# Patient Record
Sex: Female | Born: 1970
Health system: Southern US, Community
[De-identification: ages and names within clinical notes are randomized; demographics above are authoritative.]

## PROBLEM LIST (undated history)

## (undated) DIAGNOSIS — R32 Unspecified urinary incontinence: Secondary | ICD-10-CM

## (undated) DIAGNOSIS — F419 Anxiety disorder, unspecified: Secondary | ICD-10-CM

## (undated) DIAGNOSIS — I10 Essential (primary) hypertension: Secondary | ICD-10-CM

## (undated) HISTORY — DX: Essential (primary) hypertension: I10

## (undated) HISTORY — DX: Anxiety disorder, unspecified: F41.9

## (undated) HISTORY — PX: FOOT SURGERY: SHX648

## (undated) HISTORY — DX: Unspecified urinary incontinence: R32

## (undated) HISTORY — PX: WISDOM TOOTH EXTRACTION: SHX21

---

## 1998-11-14 ENCOUNTER — Other Ambulatory Visit: Admission: RE | Admit: 1998-11-14 | Discharge: 1998-11-14 | Payer: Self-pay | Admitting: Family Medicine

## 1999-01-17 ENCOUNTER — Ambulatory Visit (HOSPITAL_COMMUNITY): Admission: RE | Admit: 1999-01-17 | Discharge: 1999-01-17 | Payer: Self-pay | Admitting: *Deleted

## 1999-05-24 ENCOUNTER — Inpatient Hospital Stay (HOSPITAL_COMMUNITY): Admission: AD | Admit: 1999-05-24 | Discharge: 1999-05-26 | Payer: Self-pay | Admitting: Family Medicine

## 1999-12-23 ENCOUNTER — Other Ambulatory Visit: Admission: RE | Admit: 1999-12-23 | Discharge: 1999-12-23 | Payer: Self-pay | Admitting: Family Medicine

## 2000-12-30 ENCOUNTER — Other Ambulatory Visit: Admission: RE | Admit: 2000-12-30 | Discharge: 2000-12-30 | Payer: Self-pay | Admitting: Family Medicine

## 2001-12-19 ENCOUNTER — Other Ambulatory Visit: Admission: RE | Admit: 2001-12-19 | Discharge: 2001-12-19 | Payer: Self-pay | Admitting: Family Medicine

## 2003-02-21 ENCOUNTER — Other Ambulatory Visit: Admission: RE | Admit: 2003-02-21 | Discharge: 2003-02-21 | Payer: Self-pay | Admitting: Family Medicine

## 2003-10-26 ENCOUNTER — Other Ambulatory Visit: Admission: RE | Admit: 2003-10-26 | Discharge: 2003-10-26 | Payer: Self-pay | Admitting: Family Medicine

## 2003-12-17 ENCOUNTER — Ambulatory Visit (HOSPITAL_COMMUNITY): Admission: RE | Admit: 2003-12-17 | Discharge: 2003-12-17 | Payer: Self-pay | Admitting: Family Medicine

## 2004-05-08 ENCOUNTER — Inpatient Hospital Stay (HOSPITAL_COMMUNITY): Admission: AD | Admit: 2004-05-08 | Discharge: 2004-05-10 | Payer: Self-pay | Admitting: Family Medicine

## 2004-11-20 ENCOUNTER — Other Ambulatory Visit: Admission: RE | Admit: 2004-11-20 | Discharge: 2004-11-20 | Payer: Self-pay | Admitting: Family Medicine

## 2005-12-03 ENCOUNTER — Other Ambulatory Visit: Admission: RE | Admit: 2005-12-03 | Discharge: 2005-12-03 | Payer: Self-pay | Admitting: Family Medicine

## 2007-01-06 ENCOUNTER — Other Ambulatory Visit: Admission: RE | Admit: 2007-01-06 | Discharge: 2007-01-06 | Payer: Self-pay | Admitting: Family Medicine

## 2011-06-30 ENCOUNTER — Encounter: Payer: Self-pay | Admitting: *Deleted

## 2011-06-30 ENCOUNTER — Emergency Department (HOSPITAL_COMMUNITY): Payer: Worker's Compensation

## 2011-06-30 ENCOUNTER — Emergency Department (HOSPITAL_COMMUNITY)
Admission: EM | Admit: 2011-06-30 | Discharge: 2011-06-30 | Disposition: A | Discharge status: Home / Self Care | Payer: Worker's Compensation | Attending: Emergency Medicine | Admitting: Emergency Medicine

## 2011-06-30 DIAGNOSIS — S52123A Displaced fracture of head of unspecified radius, initial encounter for closed fracture: Secondary | ICD-10-CM | POA: Insufficient documentation

## 2011-06-30 DIAGNOSIS — X58XXXA Exposure to other specified factors, initial encounter: Secondary | ICD-10-CM | POA: Insufficient documentation

## 2011-06-30 DIAGNOSIS — F17200 Nicotine dependence, unspecified, uncomplicated: Secondary | ICD-10-CM | POA: Insufficient documentation

## 2011-06-30 DIAGNOSIS — S82009A Unspecified fracture of unspecified patella, initial encounter for closed fracture: Secondary | ICD-10-CM

## 2011-06-30 MED ORDER — OXYCODONE-ACETAMINOPHEN 5-325 MG PO TABS
2.0000 | ORAL_TABLET | Freq: Once | ORAL | Status: AC
  Administered 2011-06-30: 2 via ORAL
  Filled 2011-06-30: qty 2

## 2011-06-30 MED ORDER — ONDANSETRON HCL 4 MG PO TABS
4.0000 mg | ORAL_TABLET | Freq: Once | ORAL | Status: AC
  Administered 2011-06-30: 4 mg via ORAL
  Filled 2011-06-30: qty 1

## 2011-06-30 MED ORDER — IBUPROFEN 800 MG PO TABS
800.0000 mg | ORAL_TABLET | Freq: Once | ORAL | Status: AC
  Administered 2011-06-30: 800 mg via ORAL
  Filled 2011-06-30: qty 1

## 2011-06-30 MED ORDER — OXYCODONE-ACETAMINOPHEN 5-325 MG PO TABS
ORAL_TABLET | ORAL | Status: DC
Start: 2011-06-30 — End: 2013-05-05

## 2011-06-30 NOTE — ED Notes (Signed)
Pt reports she fell over open file cabinet drawer. Arm presently in sling.  Denies numbness or tingling in either extremity.  Able to move extremities.

## 2011-06-30 NOTE — ED Notes (Signed)
C/o right elbow pain and left knee pain s/p fall at work today; pt was seen by her PCP and had xray done of right elbow, but not knee.  Pt states she had her drug screen required by her employer performed at her PCP office today, and has accompanying paper work.

## 2011-06-30 NOTE — ED Notes (Signed)
Left in c/o family for transport home; alert, in no distress.

## 2011-06-30 NOTE — ED Provider Notes (Addendum)
History     CSN: [id number] Arrival date & time: 06/30/2011  7:48 PM  Chief Complaint  Patient presents with  . Arm Injury  . Leg Injury    (Consider location/radiation/quality/duration/timing/severity/associated sxs/prior treatment) Patient is a 40 y.o. female presenting with fall. The history is provided by the patient.  Fall The accident occurred 1 to 2 hours ago. Incident: fell over an open cabinet file drawer. She landed on a hard floor. The point of impact was the right shoulder, right elbow and left knee. The pain is present in the right elbow, left knee and right shoulder. The pain is severe. She was not ambulatory at the scene. Associated symptoms include nausea. Pertinent negatives include no visual change, no numbness, no abdominal pain, no bowel incontinence, no hematuria and no loss of consciousness. Exacerbated by: nothing.    History reviewed. No pertinent past medical history.  Past Surgical History  Procedure Date  . Foot surgery     No family history on file.  History  Substance Use Topics  . Smoking status: Current Everyday Smoker -- 0.5 packs/day  . Smokeless tobacco: Not on file  . Alcohol Use: No    OB History    Grav Para Term Preterm Abortions TAB SAB Ect Mult Living                  Review of Systems  Constitutional: Negative for activity change.       All ROS Neg except as noted in HPI  HENT: Negative for nosebleeds and neck pain.   Eyes: Negative for photophobia and discharge.  Respiratory: Negative for cough, shortness of breath and wheezing.   Cardiovascular: Negative for chest pain and palpitations.  Gastrointestinal: Positive for nausea. Negative for abdominal pain, blood in stool and bowel incontinence.  Genitourinary: Negative for dysuria, frequency and hematuria.  Musculoskeletal: Positive for arthralgias. Negative for back pain.  Skin: Negative.   Neurological: Negative for dizziness, seizures, loss of consciousness, speech  difficulty and numbness.  Psychiatric/Behavioral: Negative for hallucinations and confusion.    Allergies  Review of patient's allergies indicates no known allergies.  Home Medications   Current Outpatient Rx  Name Route Sig Dispense Refill  . OXYCODONE-ACETAMINOPHEN 5-325 MG PO TABS  1 or 2 po q4h prn pain 24 tablet 0    BP 127/77  Pulse 97  Temp(Src) 99.2 F (37.3 C) (Oral)  Resp 20  Ht 5\' 7"  (1.702 m)  Wt 210 lb (95.255 kg)  BMI 32.89 kg/m2  SpO2 98%  Physical Exam  Nursing note and vitals reviewed. Constitutional: She is oriented to person, place, and time. She appears well-developed and well-nourished.  Non-toxic appearance.  HENT:  Head: Normocephalic.  Right Ear: Tympanic membrane and external ear normal.  Left Ear: Tympanic membrane and external ear normal.  Eyes: EOM and lids are normal. Pupils are equal, round, and reactive to light.  Neck: Normal range of motion. Neck supple. Carotid bruit is not present.  Cardiovascular: Normal rate, regular rhythm, normal heart sounds, intact distal pulses and normal pulses.   Pulmonary/Chest: Breath sounds normal. No respiratory distress.  Abdominal: Soft. Bowel sounds are normal. There is no tenderness. There is no guarding.  Musculoskeletal: Normal range of motion.       There is pain to palpation of the right elbow there is swelling present. There is good range of motion of the right shoulder and right wrist.  There is good range of motion of the left hip there is pain  and swelling of the left knee. Patient cannot be in the left knee due to pain. There is no deformity of the tib-fib area. There is good range of motion of the left ankle and left toes. The distal pulses are symmetrical.  Lymphadenopathy:       Head (right side): No submandibular adenopathy present.       Head (left side): No submandibular adenopathy present.    She has no cervical adenopathy.  Neurological: She is alert and oriented to person, place, and  time. She has normal strength. No cranial nerve deficit or sensory deficit.  Skin: Skin is warm and dry.       Multiple abrasions present.  Psychiatric: She has a normal mood and affect. Her speech is normal.    ED Course  Procedures (including critical care time)FRACTURE CARE: Pt educated on the suspected fracture of the right elbow. Pt fitted with a sling, and elbow immobilized with long arm splint. FRACTURE CARE: - Pt educated on the fracture of the patella. Pt fitted with an immobilizer. Arrangements made for pt to receive walker. Pt tolerated procedures without problem.  Labs Reviewed - No data to display Dg Elbow Complete Right  06/30/2011  *RADIOLOGY REPORT*  Clinical Data: Trauma and pain.  RIGHT ELBOW - COMPLETE 3+ VIEW  Comparison: None.  Findings: Elevation of the anterior fat pad.  Presence of a subtle posterior fat pad.  On the third image, subtle irregularity of the radial head/neck junction, suspicious for fracture.  No dislocation.  IMPRESSION: Joint effusion, likely secondary to a subtle radial head/neck junction fracture.  Original Report Authenticated By: Consuello Bossier, M.D.   Dg Knee Complete 4 Views Left  06/30/2011  *RADIOLOGY REPORT*  Clinical Data: Trauma and pain.  LEFT KNEE - COMPLETE 4+ VIEW  Comparison: None.  Findings: Small suprapatellar joint effusion.  On the first oblique image, lucency through the superior lateral aspect of the patella.  IMPRESSION:  1.  Small joint effusion. 2.  Lucency through the superior lateral patella.  Favored to represent a nondisplaced fracture.  Bipartite patella felt less likely.  Original Report Authenticated By: Consuello Bossier, M.D.     1. Radial head fracture   2. Patella fracture       MDM  I have reviewed nursing notes, vital signs, and all appropriate lab and imaging results for this patient.  [place] xray reveals  A fat pad sign consistent with a radial head fx. Left knee xray suggest  Patella fracture. Splints have been  applied. Case discussed with orthopedics. Rx for Percocet ordered. Pt to return if any problems before seeing orthopedic consultant.      Kathie Dike, PA 06/30/11 [id number]  Kathie Dike, PA 10/13/11 1701  Kathie Dike, [place] 11/26/11 1349

## 2011-06-30 NOTE — ED Notes (Signed)
Posterior long arm splint applied RUE; knee immobilizer applied LLE. Tolerated well; pt instructed on proper splint care and how to monitor for proper circulation RUE.

## 2011-07-04 NOTE — ED Provider Notes (Signed)
Medical screening examination/treatment/procedure(s) were performed by non-physician practitioner and as supervising physician I was immediately available for consultation/collaboration.   Gerhard Munch, MD 07/04/11 [phone]

## 2011-10-13 NOTE — ED Provider Notes (Signed)
Medical screening examination/treatment/procedure(s) were performed by non-physician practitioner and as supervising physician I was immediately available for consultation/collaboration.   Gerhard Munch, MD 10/13/11 [phone]

## 2011-11-26 NOTE — ED Provider Notes (Signed)
Medical screening examination/treatment/procedure(s) were performed by non-physician practitioner and as supervising physician I was immediately available for consultation/collaboration.   Gerhard Munch, MD 11/26/11 1500

## 2013-03-30 ENCOUNTER — Telehealth: Payer: Self-pay | Admitting: Nurse Practitioner

## 2013-03-30 NOTE — Telephone Encounter (Signed)
appt given  

## 2013-05-05 ENCOUNTER — Ambulatory Visit (INDEPENDENT_AMBULATORY_CARE_PROVIDER_SITE_OTHER): Payer: BC Managed Care – PPO | Admitting: Nurse Practitioner

## 2013-05-05 ENCOUNTER — Encounter: Payer: Self-pay | Admitting: Nurse Practitioner

## 2013-05-05 VITALS — BP 120/78 | HR 83 | Temp 97.8°F | Ht 66.5 in | Wt 213.2 lb

## 2013-05-05 DIAGNOSIS — Z0000 Encounter for general adult medical examination without abnormal findings: Secondary | ICD-10-CM

## 2013-05-05 DIAGNOSIS — Z01419 Encounter for gynecological examination (general) (routine) without abnormal findings: Secondary | ICD-10-CM

## 2013-05-05 DIAGNOSIS — Z124 Encounter for screening for malignant neoplasm of cervix: Secondary | ICD-10-CM

## 2013-05-05 LAB — POCT CBC
Granulocyte percent: 73.5 %G (ref 37–80)
Hemoglobin: 14.7 g/dL (ref 12.2–16.2)
Lymph, poc: 2.2 (ref 0.6–3.4)
MCHC: 33.4 g/dL (ref 31.8–35.4)
MPV: 7.9 fL (ref 0–99.8)
POC Granulocyte: 6.9 (ref 2–6.9)
POC LYMPH PERCENT: 23.5 %L (ref 10–50)
RDW, POC: 12.6 %

## 2013-05-05 LAB — POCT URINALYSIS DIPSTICK
Blood, UA: NEGATIVE
Glucose, UA: NEGATIVE
Nitrite, UA: NEGATIVE
Spec Grav, UA: >=1.03
Urobilinogen, UA: NEGATIVE
pH, UA: 6

## 2013-05-05 LAB — POCT UA - MICROSCOPIC ONLY
Casts, Ur, LPF, POC: NEGATIVE
Yeast, UA: NEGATIVE

## 2013-05-05 NOTE — Patient Instructions (Signed)

## 2013-05-05 NOTE — Progress Notes (Signed)
  Subjective:    Patient ID: Michelle Blake, female    DOB: 1971-05-14, 42 y.o.   MRN: [id number]  HPI Patient in Today for a CPE and PAP - She is doing well . SHe has no current medical problems.No complaints today.    Review of Systems  All other systems reviewed and are negative.       Objective:   Physical Exam  Constitutional: She is oriented to person, place, and time. She appears well-developed and well-nourished.  HENT:  Head: Normocephalic.  Right Ear: Hearing, tympanic membrane, external ear and ear canal normal.  Left Ear: Hearing, tympanic membrane, external ear and ear canal normal.  Nose: Nose normal.  Mouth/Throat: Uvula is midline and oropharynx is clear and moist.  Eyes: Conjunctivae and EOM are normal. Pupils are equal, round, and reactive to light.  Neck: Normal range of motion and full passive range of motion without pain. Neck supple. No JVD present. Carotid bruit is not present. No mass and no thyromegaly present.  Cardiovascular: Normal rate, normal heart sounds and intact distal pulses.   No murmur heard. Pulmonary/Chest: Effort normal and breath sounds normal. Right breast exhibits no inverted nipple, no mass, no nipple discharge, no skin change and no tenderness. Left breast exhibits no inverted nipple, no mass, no nipple discharge, no skin change and no tenderness.  Abdominal: Soft. Bowel sounds are normal. She exhibits no mass. There is no tenderness.  Genitourinary: Vagina normal and uterus normal. No breast swelling, tenderness, discharge or bleeding.  bimanual exam-No adnexal masses or tenderness.  Cervix parous and pink- NO discharge  Musculoskeletal: Normal range of motion.  Lymphadenopathy:    She has no cervical adenopathy.  Neurological: She is alert and oriented to person, place, and time.  Skin: Skin is warm and dry.  Psychiatric: She has a normal mood and affect. Her behavior is normal. Judgment and thought content normal.   BP 120/78   Pulse 83  Temp(Src) 97.8 F (36.6 C) (Oral)  Ht 5' 6.5" (1.689 m)  Wt 213 lb 3.2 oz (96.707 kg)  BMI 33.9 kg/m2        Assessment & Plan:  1. Encounter for routine gynecological examination  - POCT UA - Microscopic Only - POCT urinalysis dipstick - Pap IG w/ reflex to HPV when ASC-U  2. Annual physical exam  - POCT CBC - CMP14+EGFR - NMR, lipoprofile - Thyroid Panel With TSH - Vitamin D 25 hydroxy  Diet and exercise encouraged Health maintenance discussed Patient will schedule mammogram upon leaving [facility] in 1 year for follow up and PRN  Mary-Margaret Daphine Deutscher, FNP

## 2013-05-07 LAB — VITAMIN D 25 HYDROXY (VIT D DEFICIENCY, FRACTURES): Vit D, 25-Hydroxy: 16.7 ng/mL — ABNORMAL LOW (ref 30.0–100.0)

## 2013-05-07 LAB — CMP14+EGFR
ALT: 17 IU/L (ref 0–32)
Albumin: 4.2 g/dL (ref 3.5–5.5)
BUN: 11 mg/dL (ref 6–24)
Calcium: 9.2 mg/dL (ref 8.7–10.2)
Chloride: 103 mmol/L (ref 97–108)
Glucose: 101 mg/dL — ABNORMAL HIGH (ref 65–99)
Potassium: 4.5 mmol/L (ref 3.5–5.2)
Total Protein: 6.6 g/dL (ref 6.0–8.5)

## 2013-05-07 LAB — THYROID PANEL WITH TSH
Free Thyroxine Index: 2.6 (ref 1.2–4.9)
T3 Uptake Ratio: 30 % (ref 24–39)
T4, Total: 8.8 ug/dL (ref 4.5–12.0)

## 2013-05-07 LAB — NMR, LIPOPROFILE
HDL Cholesterol by NMR: 37 mg/dL — ABNORMAL LOW (ref >=40)
LDL Particle Number: 2198 nmol/L — ABNORMAL HIGH (ref <1000)
LDL Size: 20.6 nm (ref >20.5)
LDLC SERPL CALC-MCNC: 151 mg/dL — ABNORMAL HIGH (ref <100)
Triglycerides by NMR: 137 mg/dL (ref <150)

## 2013-05-10 LAB — PAP IG W/ RFLX HPV ASCU: PAP Smear Comment: 0

## 2013-05-15 ENCOUNTER — Encounter: Payer: Self-pay | Admitting: *Deleted

## 2013-06-22 ENCOUNTER — Ambulatory Visit: Payer: BC Managed Care – PPO | Admitting: Nurse Practitioner

## 2013-06-22 ENCOUNTER — Encounter: Payer: Self-pay | Admitting: Nurse Practitioner

## 2013-06-22 ENCOUNTER — Telehealth: Payer: Self-pay | Admitting: Nurse Practitioner

## 2013-06-22 ENCOUNTER — Ambulatory Visit (INDEPENDENT_AMBULATORY_CARE_PROVIDER_SITE_OTHER): Payer: BC Managed Care – PPO | Admitting: Nurse Practitioner

## 2013-06-22 VITALS — BP 127/87 | HR 104 | Temp 99.7°F | Ht 66.5 in | Wt 212.5 lb

## 2013-06-22 DIAGNOSIS — J069 Acute upper respiratory infection, unspecified: Secondary | ICD-10-CM

## 2013-06-22 DIAGNOSIS — R509 Fever, unspecified: Secondary | ICD-10-CM

## 2013-06-22 LAB — POCT RAPID STREP A (OFFICE): Rapid Strep A Screen: NEGATIVE

## 2013-06-22 MED ORDER — AZITHROMYCIN 250 MG PO TABS: ORAL_TABLET | ORAL | Status: DC

## 2013-06-22 NOTE — Patient Instructions (Signed)

## 2013-06-22 NOTE — Progress Notes (Signed)
  Subjective:    Patient ID: Michelle Blake, female    DOB: October 02, 1970, 42 y.o.   MRN: [id number]  HPI Patient in today c/o cough, congestion and sore throat- started last night- motrin OTC helped some.    Review of Systems  Constitutional: Positive for fever and chills.  HENT: Positive for congestion, sore throat, rhinorrhea and sinus pressure. Negative for ear pain, trouble swallowing and voice change.   Respiratory: Positive for cough.   Cardiovascular: Negative.   Gastrointestinal: Negative.   Genitourinary: Positive for decreased urine volume.  Musculoskeletal: Negative.   Skin: Negative.        Objective:   Physical Exam  Constitutional: She appears well-developed and well-nourished.  HENT:  Right Ear: Hearing, tympanic membrane, external ear and ear canal normal.  Left Ear: Hearing, tympanic membrane, external ear and ear canal normal.  Nose: Mucosal edema and rhinorrhea present. Right sinus exhibits no maxillary sinus tenderness and no frontal sinus tenderness. Left sinus exhibits no maxillary sinus tenderness and no frontal sinus tenderness.  Mouth/Throat: Posterior oropharyngeal erythema (mild) present.  Cardiovascular: Normal rate, regular rhythm and normal heart sounds.   Pulmonary/Chest: Effort normal and breath sounds normal. No respiratory distress. She has no wheezes. She has no rales. She exhibits no tenderness.  Dry cough  Skin: Skin is warm.  Psychiatric: She has a normal mood and affect. Her behavior is normal. Judgment and thought content normal.   BP 127/87  Pulse 104  Temp(Src) 99.7 F (37.6 C) (Oral)  Ht 5' 6.5" (1.689 m)  Wt 212 lb 8 oz (96.389 kg)  BMI 33.79 kg/m2   Results for orders placed in visit on 06/22/13  POCT RAPID STREP A (OFFICE)      Result Value Range   Rapid Strep A Screen Negative  Negative        Assessment & Plan:   1. Fever, unspecified   2. Upper respiratory infection with cough and congestion    Meds ordered this  encounter  Medications  . azithromycin (ZITHROMAX) 250 MG tablet    Sig: As directed    Dispense:  6 each    Refill:  0    Order Specific Question:  Supervising Provider    Answer:  Ernestina Penna [[id number]]   1. Take meds as prescribed 2. Use a cool mist humidifier especially during the winter months and when heat has  been humid. 3. Use saline nose sprays frequently 4. Saline irrigations of the nose can be very helpful if done frequently.  * 4X daily for 1 week*  * Use of a nettie pot can be helpful with this. Follow directions with this* 5. Drink plenty of fluids 6. Keep thermostat turn down low 7.For any cough or congestion  Use plain Mucinex- regular strength or max strength is fine   * Children- consult with Pharmacist for dosing 8. For fever or aces or pains- take tylenol or ibuprofen appropriate for age and weight.  * for fevers greater than 101 orally you may alternate ibuprofen and tylenol every  3 hours.   Mary-Margaret Daphine Deutscher, FNP

## 2013-06-22 NOTE — Telephone Encounter (Signed)
Error

## 2013-06-26 ENCOUNTER — Other Ambulatory Visit: Payer: Self-pay | Admitting: Nurse Practitioner

## 2013-06-26 DIAGNOSIS — R928 Other abnormal and inconclusive findings on diagnostic imaging of breast: Secondary | ICD-10-CM

## 2013-11-21 ENCOUNTER — Telehealth: Payer: Self-pay | Admitting: Nurse Practitioner

## 2013-11-21 NOTE — Telephone Encounter (Signed)
Patient states that she has already went somewhere else for care

## 2014-06-13 ENCOUNTER — Encounter: Payer: Self-pay | Admitting: Family

## 2014-06-13 ENCOUNTER — Ambulatory Visit (INDEPENDENT_AMBULATORY_CARE_PROVIDER_SITE_OTHER): Payer: BC Managed Care – PPO | Admitting: Family

## 2014-06-13 VITALS — BP 153/97 | HR 89 | Temp 99.1°F | Ht 66.5 in | Wt 228.4 lb

## 2014-06-13 DIAGNOSIS — J209 Acute bronchitis, unspecified: Secondary | ICD-10-CM

## 2014-06-13 MED ORDER — ALBUTEROL SULFATE HFA 108 (90 BASE) MCG/ACT IN AERS: 2.0000 | INHALATION_SPRAY | Freq: Four times a day (QID) | RESPIRATORY_TRACT | Status: DC | PRN

## 2014-06-13 MED ORDER — AMOXICILLIN-POT CLAVULANATE 875-125 MG PO TABS: 1.0000 | ORAL_TABLET | Freq: Two times a day (BID) | ORAL | Status: DC

## 2014-06-13 MED ORDER — BENZONATATE 200 MG PO CAPS: 200.0000 mg | ORAL_CAPSULE | Freq: Three times a day (TID) | ORAL | Status: DC | PRN

## 2014-06-13 MED ORDER — METHYLPREDNISOLONE (PAK) 4 MG PO TABS: ORAL_TABLET | ORAL | Status: DC

## 2014-06-13 NOTE — Progress Notes (Signed)
Subjective:    Patient ID: Michelle Blake, female    DOB: January 03, 1971, 43 y.o.   MRN: [id number]  Cough This is a new problem. The current episode started in the past 7 days (Saturday). The problem has been unchanged. The problem occurs constantly. The cough is non-productive. Associated symptoms include a fever, nasal congestion, postnasal drip, rhinorrhea, a sore throat, shortness of breath and wheezing. Pertinent negatives include no chills, ear congestion, ear pain, headaches or hemoptysis. The symptoms are aggravated by lying down. She has tried OTC cough suppressant and rest for the symptoms. The treatment provided mild relief. There is no history of asthma or COPD.      Review of Systems  Constitutional: Positive for fever. Negative for chills.  HENT: Positive for postnasal drip, rhinorrhea and sore throat. Negative for ear pain.   Eyes: Negative.   Respiratory: Positive for cough, shortness of breath and wheezing. Negative for hemoptysis.   Cardiovascular: Negative.  Negative for palpitations.  Gastrointestinal: Negative.   Endocrine: Negative.   Genitourinary: Negative.   Musculoskeletal: Negative.   Neurological: Negative.  Negative for headaches.  Hematological: Negative.   Psychiatric/Behavioral: Negative.   All other systems reviewed and are negative.      Objective:   Physical Exam  Vitals reviewed. Constitutional: She is oriented to person, place, and time. She appears well-developed and well-nourished. No distress.  HENT:  Head: Normocephalic and atraumatic.  Right Ear: External ear normal.  Left Ear: External ear normal.  Nasal passage erythemas with mild swelling Oropharynx erythemas  Eyes: Pupils are equal, round, and reactive to light.  Neck: Normal range of motion. Neck supple. No thyromegaly present.  Cardiovascular: Normal rate, regular rhythm, normal heart sounds and intact distal pulses.   No murmur heard. Pulmonary/Chest: Effort normal. No  respiratory distress. She has wheezes.  Coarse breath sounds bilaterally   Abdominal: Soft. Bowel sounds are normal. She exhibits no distension. There is no tenderness.  Musculoskeletal: Normal range of motion. She exhibits no edema and no tenderness.  Neurological: She is alert and oriented to person, place, and time. She has normal reflexes. No cranial nerve deficit.  Skin: Skin is warm and dry.  Psychiatric: She has a normal mood and affect. Her behavior is normal. Judgment and thought content normal.    BP 153/97  Pulse 89  Temp(Src) 99.1 F (37.3 C) (Oral)  Ht 5' 6.5" (1.689 m)  Wt 228 lb 6.4 oz (103.602 kg)  BMI 36.32 kg/m2  SpO2 93%       Assessment & Plan:  1. Acute bronchitis, unspecified organism -- Take meds as prescribed - Use a cool mist humidifier  -Use saline nose sprays frequently -Saline irrigations of the nose can be very helpful if done frequently.  * 4X daily for 1 week*  * Use of a nettie pot can be helpful with this. Follow directions with this* -Force fluids -For any cough or congestion  Use plain Mucinex- regular strength or max strength is fine   * Children- consult with Pharmacist for dosing -For fever or aces or pains- take tylenol or ibuprofen appropriate for age and weight.  * for fevers greater than 101 orally you may alternate ibuprofen and tylenol every  3 hours. -Throat lozenges if help - amoxicillin-clavulanate (AUGMENTIN) 875-125 MG per tablet; Take 1 tablet by mouth 2 (two) times daily.  Dispense: 20 tablet; Refill: 0 - benzonatate (TESSALON) 200 MG capsule; Take 1 capsule (200 mg total) by mouth 3 (three) times daily  as needed.  Dispense: 30 capsule; Refill: 1 - albuterol (PROVENTIL HFA;VENTOLIN HFA) 108 (90 BASE) MCG/ACT inhaler; Inhale 2 puffs into the lungs every 6 (six) hours as needed for wheezing or shortness of breath.  Dispense: 1 Inhaler; Refill: 2 - methylPREDNIsolone (MEDROL DOSPACK) 4 MG tablet; follow package directions   Dispense: 21 tablet; Refill: 0  Jannifer Rodney, FNP

## 2014-06-13 NOTE — Patient Instructions (Addendum)
Acute Bronchitis Bronchitis is inflammation of the airways that extend from the windpipe into the lungs (bronchi). The inflammation often causes mucus to develop. This leads to a cough, which is the most common symptom of bronchitis.  In acute bronchitis, the condition usually develops suddenly and goes away over time, usually in a couple weeks. Smoking, allergies, and asthma can make bronchitis worse. Repeated episodes of bronchitis may cause further lung problems.  CAUSES Acute bronchitis is most often caused by the same virus that causes a cold. The virus can spread from person to person (contagious) through coughing, sneezing, and touching contaminated objects. SIGNS AND SYMPTOMS   Cough.   Fever.   Coughing up mucus.   Body aches.   Chest congestion.   Chills.   Shortness of breath.   Sore throat.  DIAGNOSIS  Acute bronchitis is usually diagnosed through a physical exam. Your health care provider will also ask you questions about your medical history. Tests, such as chest X-rays, are sometimes done to rule out other conditions.  TREATMENT  Acute bronchitis usually goes away in a couple weeks. Oftentimes, no medical treatment is necessary. Medicines are sometimes given for relief of fever or cough. Antibiotic medicines are usually not needed but may be prescribed in certain situations. In some cases, an inhaler may be recommended to help reduce shortness of breath and control the cough. A cool mist vaporizer may also be used to help thin bronchial secretions and make it easier to clear the chest.  HOME CARE INSTRUCTIONS  Get plenty of rest.   Drink enough fluids to keep your urine clear or pale yellow (unless you have a medical condition that requires fluid restriction). Increasing fluids may help thin your respiratory secretions (sputum) and reduce chest congestion, and it will prevent dehydration.   Take medicines only as directed by your health care provider.  If  you were prescribed an antibiotic medicine, finish it all even if you start to feel better.  Avoid smoking and secondhand smoke. Exposure to cigarette smoke or irritating chemicals will make bronchitis worse. If you are a smoker, consider using nicotine gum or skin patches to help control withdrawal symptoms. Quitting smoking will help your lungs heal faster.   Reduce the chances of another bout of acute bronchitis by washing your hands frequently, avoiding people with cold symptoms, and trying not to touch your hands to your mouth, nose, or eyes.   Keep all follow-up visits as directed by your health care provider.  SEEK MEDICAL CARE IF: Your symptoms do not improve after 1 week of treatment.  SEEK IMMEDIATE MEDICAL CARE IF:  You develop an increased fever or chills.   You have chest pain.   You have severe shortness of breath.  You have bloody sputum.   You develop dehydration.  You faint or repeatedly feel like you are going to pass out.  You develop repeated vomiting.  You develop a severe headache. MAKE SURE YOU:   Understand these instructions.  Will watch your condition.  Will get help right away if you are not doing well or get worse. Document Released: 10/15/2004 Document Revised: 01/22/2014 Document Reviewed: 02/28/2013 [employer] Patient Information 2015 [employer]. This information is not intended to replace advice given to you by your health care provider. Make sure you discuss any questions you have with your health care provider.  - Take meds as prescribed - Use a cool mist humidifier  -Use saline nose sprays frequently -Saline irrigations of the nose can   be very helpful if done frequently.  * 4X daily for 1 week*  * Use of a nettie pot can be helpful with this. Follow directions with this* -Force fluids -For any cough or congestion  Use plain Mucinex- regular strength or max strength is fine   * Children- consult with Pharmacist for dosing -For  fever or aces or pains- take tylenol or ibuprofen appropriate for age and weight.  * for fevers greater than 101 orally you may alternate ibuprofen and tylenol every  3 hours. -Throat lozenges if help    Caria Transue, FNP  

## 2014-07-10 ENCOUNTER — Encounter: Payer: Self-pay | Admitting: Nurse Practitioner

## 2014-07-10 ENCOUNTER — Ambulatory Visit (INDEPENDENT_AMBULATORY_CARE_PROVIDER_SITE_OTHER): Payer: BC Managed Care – PPO | Admitting: Nurse Practitioner

## 2014-07-10 VITALS — BP 145/86 | HR 92 | Temp 98.3°F | Ht 66.0 in | Wt 226.0 lb

## 2014-07-10 DIAGNOSIS — Z01419 Encounter for gynecological examination (general) (routine) without abnormal findings: Secondary | ICD-10-CM

## 2014-07-10 DIAGNOSIS — Z23 Encounter for immunization: Secondary | ICD-10-CM

## 2014-07-10 DIAGNOSIS — Z0000 Encounter for general adult medical examination without abnormal findings: Secondary | ICD-10-CM

## 2014-07-10 LAB — POCT UA - MICROSCOPIC ONLY
Bacteria, U Microscopic: NEGATIVE
CRYSTALS, UR, HPF, POC: NEGATIVE
Casts, Ur, LPF, POC: NEGATIVE
MUCUS UA: NEGATIVE
RBC, URINE, MICROSCOPIC: NEGATIVE
YEAST UA: NEGATIVE

## 2014-07-10 LAB — POCT CBC
Granulocyte percent: 71.2 %G (ref 37–80)
HEMATOCRIT: 40.3 % (ref 37.7–47.9)
HEMOGLOBIN: 13.4 g/dL (ref 12.2–16.2)
Lymph, poc: 2.2 (ref 0.6–3.4)
MCH, POC: 32.1 pg — AB (ref 27–31.2)
MCHC: 33.2 g/dL (ref 31.8–35.4)
MCV: 96.5 fL (ref 80–97)
MPV: 8.2 fL (ref 0–99.8)
POC GRANULOCYTE: 6 (ref 2–6.9)
POC LYMPH %: 25.9 % (ref 10–50)
Platelet Count, POC: 254 10*3/uL (ref 142–424)
RBC: 4.2 M/uL (ref 4.04–5.48)
RDW, POC: 13.3 %
WBC: 8.4 10*3/uL (ref 4.6–10.2)

## 2014-07-10 LAB — POCT URINALYSIS DIPSTICK
BILIRUBIN UA: NEGATIVE
Glucose, UA: NEGATIVE
KETONES UA: NEGATIVE
LEUKOCYTES UA: NEGATIVE
Nitrite, UA: NEGATIVE
PH UA: 6
PROTEIN UA: NEGATIVE
RBC UA: NEGATIVE
Spec Grav, UA: 1.015
Urobilinogen, UA: NEGATIVE

## 2014-07-10 NOTE — Progress Notes (Signed)
   Subjective:    Patient ID: Michelle Blake, female    DOB: March 15, 1971, 43 y.o.   MRN: [id number]  HPI Patient in today for Annual physical exam with PAP. She is doing well. SHe has no chronic medical problems and is on no medications.    Review of Systems  Constitutional: Negative.   HENT: Negative.   Respiratory: Negative.   Cardiovascular: Negative.   Genitourinary: Negative.   Neurological: Negative.   Psychiatric/Behavioral: Negative.   All other systems reviewed and are negative.      Objective:   Physical Exam  Constitutional: She is oriented to person, place, and time. She appears well-developed and well-nourished.  HENT:  Head: Normocephalic.  Right Ear: Hearing, tympanic membrane, external ear and ear canal normal.  Left Ear: Hearing, tympanic membrane, external ear and ear canal normal.  Nose: Nose normal.  Mouth/Throat: Uvula is midline and oropharynx is clear and moist.  Eyes: Conjunctivae and EOM are normal. Pupils are equal, round, and reactive to light.  Neck: Normal range of motion and full passive range of motion without pain. Neck supple. No JVD present. Carotid bruit is not present. No mass and no thyromegaly present.  Cardiovascular: Normal rate, normal heart sounds and intact distal pulses.   No murmur heard. Pulmonary/Chest: Effort normal and breath sounds normal. Right breast exhibits no inverted nipple, no mass, no nipple discharge, no skin change and no tenderness. Left breast exhibits no inverted nipple, no mass, no nipple discharge, no skin change and no tenderness.  Abdominal: Soft. Bowel sounds are normal. She exhibits no mass. There is no tenderness.  Genitourinary: Vagina normal and uterus normal. No breast swelling, tenderness, discharge or bleeding.  bimanual exam-No adnexal masses or tenderness. Cervix parous and pink no discharge  Musculoskeletal: Normal range of motion.  Lymphadenopathy:    She has no cervical adenopathy.  Neurological:  She is alert and oriented to person, place, and time.  Skin: Skin is warm and dry.  Psychiatric: She has a normal mood and affect. Her behavior is normal. Judgment and thought content normal.    BP 145/86  Pulse 92  Temp(Src) 98.3 F (36.8 C) (Oral)  Ht $R'5\' 6"'Me$  (1.676 m)  Wt 226 lb (102.513 kg)  BMI 36.49 kg/m2        Assessment & Plan:  1. Annual physical exam  - POCT UA - Microscopic Only - POCT urinalysis dipstick - POCT CBC - CMP14+EGFR - NMR, lipoprofile - Thyroid Panel With TSH - Vit D  25 hydroxy (rtn osteoporosis monitoring)  2. Encounter for routine gynecological examination - Pap IG w/ reflex to HPV when ASC-U  Patient to schedule mammogram tdap today Labs pending Health maintenance reviewed Diet and exercise encouraged Continue all meds Follow up  In 1 year   [place], FNP

## 2014-07-10 NOTE — Patient Instructions (Signed)

## 2014-07-11 ENCOUNTER — Telehealth: Payer: Self-pay | Admitting: *Deleted

## 2014-07-11 LAB — CMP14+EGFR
A/G RATIO: 1.6 (ref 1.1–2.5)
ALBUMIN: 3.7 g/dL (ref 3.5–5.5)
ALT: 14 IU/L (ref 0–32)
AST: 19 IU/L (ref 0–40)
Alkaline Phosphatase: 75 IU/L (ref 39–117)
BUN/Creatinine Ratio: 12 (ref 9–23)
BUN: 11 mg/dL (ref 6–24)
CALCIUM: 8.5 mg/dL — AB (ref 8.7–10.2)
CO2: 23 mmol/L (ref 18–29)
CREATININE: 0.93 mg/dL (ref 0.57–1.00)
Chloride: 102 mmol/L (ref 97–108)
GFR, EST AFRICAN AMERICAN: 87 mL/min/{1.73_m2} (ref >59)
GFR, EST NON AFRICAN AMERICAN: 76 mL/min/{1.73_m2} (ref >59)
GLOBULIN, TOTAL: 2.3 g/dL (ref 1.5–4.5)
GLUCOSE: 93 mg/dL (ref 65–99)
POTASSIUM: 4.4 mmol/L (ref 3.5–5.2)
Sodium: 138 mmol/L (ref 134–144)
TOTAL PROTEIN: 6 g/dL (ref 6.0–8.5)

## 2014-07-11 LAB — NMR, LIPOPROFILE
CHOLESTEROL: 193 mg/dL (ref 100–199)
HDL CHOLESTEROL BY NMR: 32 mg/dL — AB (ref >39)
HDL PARTICLE NUMBER: 22.9 umol/L — AB (ref >=30.5)
LDL Particle Number: 1649 nmol/L — ABNORMAL HIGH (ref <1000)
LDL SIZE: 20.9 nm (ref >20.5)
LDLC SERPL CALC-MCNC: 137 mg/dL — ABNORMAL HIGH (ref 0–99)
LP-IR Score: 40 (ref <=45)
SMALL LDL PARTICLE NUMBER: 697 nmol/L — AB (ref <=527)
TRIGLYCERIDES BY NMR: 118 mg/dL (ref 0–149)

## 2014-07-11 LAB — THYROID PANEL WITH TSH
Free Thyroxine Index: 2.2 (ref 1.2–4.9)
T3 Uptake Ratio: 30 % (ref 24–39)
T4 TOTAL: 7.4 ug/dL (ref 4.5–12.0)
TSH: 0.907 u[IU]/mL (ref 0.450–4.500)

## 2014-07-11 LAB — PAP IG W/ RFLX HPV ASCU: PAP SMEAR COMMENT: 0

## 2014-07-11 LAB — VITAMIN D 25 HYDROXY (VIT D DEFICIENCY, FRACTURES): VIT D 25 HYDROXY: 20.3 ng/mL — AB (ref 30.0–100.0)

## 2014-07-11 NOTE — Telephone Encounter (Signed)
Aware. 

## 2014-07-11 NOTE — Telephone Encounter (Signed)
Message copied by Almeta MonasSTONE, Ashawnti Tangen M on Wed Jul 11, 2014  4:07 PM ------      Message from: Bennie PieriniMARTIN, MARY-MARGARET      Created: Wed Jul 11, 2014  2:36 PM       Cbc normal      Urine clear      Kidney and liver function stable      LDL particle numbers and ldl are improving- continue low fat diet or will need to go on meds      Thyroid normal      Vitamin d low -vitamin d 2000 iu OTC daily      Continue current meds- low fat diet and exercise and recheck in 3 months       ------

## 2014-07-12 ENCOUNTER — Telehealth: Payer: Self-pay | Admitting: *Deleted

## 2014-07-12 NOTE — Telephone Encounter (Signed)
Message copied by Almeta MonasSTONE, Marlicia Sroka M on Thu Jul 12, 2014  9:02 AM ------      Message from: Bennie PieriniMARTIN, MARY-MARGARET      Created: Thu Jul 12, 2014  8:52 AM       PAP normal- repeat in 2 years ------

## 2014-07-12 NOTE — Telephone Encounter (Signed)
Pap normal,repeat in two years.

## 2015-04-25 ENCOUNTER — Other Ambulatory Visit: Payer: Self-pay | Admitting: Physician Assistant

## 2015-04-25 DIAGNOSIS — Z20818 Contact with and (suspected) exposure to other bacterial communicable diseases: Secondary | ICD-10-CM

## 2015-04-25 DIAGNOSIS — B373 Candidiasis of vulva and vagina: Secondary | ICD-10-CM

## 2015-04-25 DIAGNOSIS — B3731 Acute candidiasis of vulva and vagina: Secondary | ICD-10-CM

## 2015-04-25 MED ORDER — FLUCONAZOLE 150 MG PO TABS: ORAL_TABLET | ORAL | Status: DC

## 2015-04-25 MED ORDER — AMOXICILLIN 875 MG PO TABS: 875.0000 mg | ORAL_TABLET | Freq: Two times a day (BID) | ORAL | Status: DC

## 2015-08-29 ENCOUNTER — Ambulatory Visit (INDEPENDENT_AMBULATORY_CARE_PROVIDER_SITE_OTHER): Payer: BLUE CROSS/BLUE SHIELD | Admitting: Nurse Practitioner

## 2015-08-29 ENCOUNTER — Encounter: Payer: Self-pay | Admitting: Nurse Practitioner

## 2015-08-29 VITALS — BP 128/82 | HR 93 | Temp 97.2°F | Ht 66.0 in | Wt 229.0 lb

## 2015-08-29 DIAGNOSIS — Z0000 Encounter for general adult medical examination without abnormal findings: Secondary | ICD-10-CM

## 2015-08-29 DIAGNOSIS — Z01419 Encounter for gynecological examination (general) (routine) without abnormal findings: Secondary | ICD-10-CM | POA: Diagnosis not present

## 2015-08-29 NOTE — Addendum Note (Signed)
Addended by: Bennie PieriniMARTIN, MARY-MARGARET on: 08/29/2015 09:50 AM   Modules accepted: K[employer]od[employer]Set

## 2015-08-29 NOTE — Progress Notes (Addendum)
   Subjective:    Patient ID: Michelle Blake, female    DOB: [birth year], 44 y.o.   MRN: [id number]  HPI Patient is here today for CPE and PAP. She has no current medical problems and is on  No meds.    Review of Systems  Constitutional: Negative.   HENT: Negative.   Respiratory: Negative.   Cardiovascular: Negative.   Genitourinary: Negative.   Neurological: Negative.   Psychiatric/Behavioral: Negative.   All other systems reviewed and are negative.      Objective:   Physical Exam  Constitutional: She is oriented to person, place, and time. She appears well-developed and well-nourished.  HENT:  Head: Normocephalic.  Right Ear: Hearing, tympanic membrane, external ear and ear canal normal.  Left Ear: Hearing, tympanic membrane, external ear and ear canal normal.  Nose: Nose normal.  Mouth/Throat: Uvula is midline and oropharynx is clear and moist.  Eyes: Conjunctivae and EOM are normal. Pupils are equal, round, and reactive to light.  Neck: Normal range of motion and full passive range of motion without pain. Neck supple. No JVD present. Carotid bruit is not present. No thyroid mass and no thyromegaly present.  Cardiovascular: Normal rate, normal heart sounds and intact distal pulses.   No murmur heard. Pulmonary/Chest: Effort normal and breath sounds normal. Right breast exhibits no inverted nipple, no mass, no nipple discharge, no skin change and no tenderness. Left breast exhibits no inverted nipple, no mass, no nipple discharge, no skin change and no tenderness.  Abdominal: Soft. Bowel sounds are normal. She exhibits no mass. There is no tenderness.  Genitourinary: Vagina normal and uterus normal. No breast swelling, tenderness, discharge or bleeding.  bimanual exam-No adnexal masses or tenderness. Cervix parous and pink- no discharge  Musculoskeletal: Normal range of motion.  Lymphadenopathy:    She has no cervical adenopathy.  Neurological: She is alert and oriented to  person, place, and time.  Skin: Skin is warm and dry.  Psychiatric: She has a normal mood and affect. Her behavior is normal. Judgment and thought content normal.   BP 128/82 mmHg  Pulse 93  Temp(Src) 97.2 F (36.2 C) (Oral)  Ht $R'5\' 6"'OU$  (1.676 m)  Wt 229 lb (103.874 kg)  BMI 36.98 kg/m2        Assessment & Plan:  1. Annual physical exam  - CBC with Differential/Platelet - CMP14+EGFR - Lipid panel - Thyroid Panel With TSH  2. Encounter for routine gynecological examination PAP IG w/ reflex to HPV when ASC-U  Patient to schedule mmao Labs pending Health maintenance reviewed Diet and exercise encouraged Continue all meds Follow up  In 1 year   [place], FNP

## 2015-08-29 NOTE — Patient Instructions (Signed)
Health Maintenance, Female Adopting a healthy lifestyle and getting preventive care can go a long way to promote health and wellness. Talk with your health care provider about what schedule of regular examinations is right for you. This is a good chance for you to check in with your provider about disease prevention and staying healthy. In between checkups, there are plenty of things you can do on your own. Experts have done a lot of research about which lifestyle changes and preventive measures are most likely to keep you healthy. Ask your health care provider for more information. WEIGHT AND DIET  Eat a healthy diet  Be sure to include plenty of vegetables, fruits, low-fat dairy products, and lean protein.  Do not eat a lot of foods high in solid fats, added sugars, or salt.  Get regular exercise. This is one of the most important things you can do for your health.  Most adults should exercise for at least 150 minutes each week. The exercise should increase your heart rate and make you sweat (moderate-intensity exercise).  Most adults should also do strengthening exercises at least twice a week. This is in addition to the moderate-intensity exercise.  Maintain a healthy weight  Body mass index (BMI) is a measurement that can be used to identify possible weight problems. It estimates body fat based on height and weight. Your health care provider can help determine your BMI and help you achieve or maintain a healthy weight.  For females 20 years of age and older:   A BMI below 18.5 is considered underweight.  A BMI of 18.5 to 24.9 is normal.  A BMI of 25 to 29.9 is considered overweight.  A BMI of 30 and above is considered obese.  Watch levels of cholesterol and blood lipids  You should start having your blood tested for lipids and cholesterol at 44 years of age, then have this test every 5 years.  You may need to have your cholesterol levels checked more often if:  Your lipid  or cholesterol levels are high.  You are older than 44 years of age.  You are at high risk for heart disease.  CANCER SCREENING   Lung Cancer  Lung cancer screening is recommended for adults 55-80 years old who are at high risk for lung cancer because of a history of smoking.  A yearly low-dose CT scan of the lungs is recommended for people who:  Currently smoke.  Have quit within the past 15 years.  Have at least a 30-pack-year history of smoking. A pack year is smoking an average of one pack of cigarettes a day for 1 year.  Yearly screening should continue until it has been 15 years since you quit.  Yearly screening should stop if you develop a health problem that would prevent you from having lung cancer treatment.  Breast Cancer  Practice breast self-awareness. This means understanding how your breasts normally appear and feel.  It also means doing regular breast self-exams. Let your health care provider know about any changes, no matter how small.  If you are in your 20s or 30s, you should have a clinical breast exam (CBE) by a health care provider every 1-3 years as part of a regular health exam.  If you are 40 or older, have a CBE every year. Also consider having a breast X-ray (mammogram) every year.  If you have a family history of breast cancer, talk to your health care provider about genetic screening.  If you   are at high risk for breast cancer, talk to your health care provider about having an MRI and a mammogram every year.  Breast cancer gene (BRCA) assessment is recommended for women who have family members with BRCA-related cancers. BRCA-related cancers include:  Breast.  Ovarian.  Tubal.  Peritoneal cancers.  Results of the assessment will determine the need for genetic counseling and BRCA1 and BRCA2 testing. Cervical Cancer Your health care provider may recommend that you be screened regularly for cancer of the pelvic organs (ovaries, uterus, and  vagina). This screening involves a pelvic examination, including checking for microscopic changes to the surface of your cervix (Pap test). You may be encouraged to have this screening done every 3 years, beginning at age 21.  For women ages 30-65, health care providers may recommend pelvic exams and Pap testing every 3 years, or they may recommend the Pap and pelvic exam, combined with testing for human papilloma virus (HPV), every 5 years. Some types of HPV increase your risk of cervical cancer. Testing for HPV may also be done on women of any age with unclear Pap test results.  Other health care providers may not recommend any screening for nonpregnant women who are considered low risk for pelvic cancer and who do not have symptoms. Ask your health care provider if a screening pelvic exam is right for you.  If you have had past treatment for cervical cancer or a condition that could lead to cancer, you need Pap tests and screening for cancer for at least 20 years after your treatment. If Pap tests have been discontinued, your risk factors (such as having a new sexual partner) need to be reassessed to determine if screening should resume. Some women have medical problems that increase the chance of getting cervical cancer. In these cases, your health care provider may recommend more frequent screening and Pap tests. Colorectal Cancer  This type of cancer can be detected and often prevented.  Routine colorectal cancer screening usually begins at 44 years of age and continues through 44 years of age.  Your health care provider may recommend screening at an earlier age if you have risk factors for colon cancer.  Your health care provider may also recommend using home test kits to check for hidden blood in the stool.  A small camera at the end of a tube can be used to examine your colon directly (sigmoidoscopy or colonoscopy). This is done to check for the earliest forms of colorectal  cancer.  Routine screening usually begins at age 50.  Direct examination of the colon should be repeated every 5-10 years through 44 years of age. However, you may need to be screened more often if early forms of precancerous polyps or small growths are found. Skin Cancer  Check your skin from head to toe regularly.  Tell your health care provider about any new moles or changes in moles, especially if there is a change in a mole's shape or color.  Also tell your health care provider if you have a mole that is larger than the size of a pencil eraser.  Always use sunscreen. Apply sunscreen liberally and repeatedly throughout the day.  Protect yourself by wearing long sleeves, pants, a wide-brimmed hat, and sunglasses whenever you are outside. HEART DISEASE, DIABETES, AND HIGH BLOOD PRESSURE   High blood pressure causes heart disease and increases the risk of stroke. High blood pressure is more likely to develop in:  People who have blood pressure in the high end   of the normal range (130-139/85-89 mm Hg).  People who are overweight or obese.  People who are African American.  If you are 38-23 years of age, have your blood pressure checked every 3-5 years. If you are 61 years of age or older, have your blood pressure checked every year. You should have your blood pressure measured twice--once when you are at a hospital or clinic, and once when you are not at a hospital or clinic. Record the average of the two measurements. To check your blood pressure when you are not at a hospital or clinic, you can use:  An automated blood pressure machine at a pharmacy.  A home blood pressure monitor.  If you are between 45 years and 39 years old, ask your health care provider if you should take aspirin to prevent strokes.  Have regular diabetes screenings. This involves taking a blood sample to check your fasting blood sugar level.  If you are at a normal weight and have a low risk for diabetes,  have this test once every three years after 44 years of age.  If you are overweight and have a high risk for diabetes, consider being tested at a younger age or more often. PREVENTING INFECTION  Hepatitis B  If you have a higher risk for hepatitis B, you should be screened for this virus. You are considered at high risk for hepatitis B if:  You were born in a country where hepatitis B is common. Ask your health care provider which countries are considered high risk.  Your parents were born in a high-risk country, and you have not been immunized against hepatitis B (hepatitis B vaccine).  You have HIV or AIDS.  You use needles to inject street drugs.  You live with someone who has hepatitis B.  You have had sex with someone who has hepatitis B.  You get hemodialysis treatment.  You take certain medicines for conditions, including cancer, organ transplantation, and autoimmune conditions. Hepatitis C  Blood testing is recommended for:  Everyone born from [birth year] through 1965.  Anyone with known risk factors for hepatitis C. Sexually transmitted infections (STIs)  You should be screened for sexually transmitted infections (STIs) including gonorrhea and chlamydia if:  You are sexually active and are younger than 44 years of age.  You are older than 44 years of age and your health care provider tells you that you are at risk for this type of infection.  Your sexual activity has changed since you were last screened and you are at an increased risk for chlamydia or gonorrhea. Ask your health care provider if you are at risk.  If you do not have HIV, but are at risk, it may be recommended that you take a prescription medicine daily to prevent HIV infection. This is called pre-exposure prophylaxis (PrEP). You are considered at risk if:  You are sexually active and do not regularly use condoms or know the HIV status of your partner(s).  You take drugs by injection.  You are sexually  active with a partner who has HIV. Talk with your health care provider about whether you are at high risk of being infected with HIV. If you choose to begin PrEP, you should first be tested for HIV. You should then be tested every 3 months for as long as you are taking PrEP.  PREGNANCY   If you are premenopausal and you may become pregnant, ask your health care provider about preconception counseling.  If you may  become pregnant, take 400 to 800 micrograms (mcg) of folic acid every day.  If you want to prevent pregnancy, talk to your health care provider about birth control (contraception). OSTEOPOROSIS AND MENOPAUSE   Osteoporosis is a disease in which the bones lose minerals and strength with aging. This can result in serious bone fractures. Your risk for osteoporosis can be identified using a bone density scan.  If you are 61 years of age or older, or if you are at risk for osteoporosis and fractures, ask your health care provider if you should be screened.  Ask your health care provider whether you should take a calcium or vitamin D supplement to lower your risk for osteoporosis.  Menopause may have certain physical symptoms and risks.  Hormone replacement therapy may reduce some of these symptoms and risks. Talk to your health care provider about whether hormone replacement therapy is right for you.  HOME CARE INSTRUCTIONS   Schedule regular health, dental, and eye exams.  Stay current with your immunizations.   Do not use any tobacco products including cigarettes, chewing tobacco, or electronic cigarettes.  If you are pregnant, do not drink alcohol.  If you are breastfeeding, limit how much and how often you drink alcohol.  Limit alcohol intake to no more than 1 drink per day for nonpregnant women. One drink equals 12 ounces of beer, 5 ounces of wine, or 1 ounces of hard liquor.  Do not use street drugs.  Do not share needles.  Ask your health care provider for help if  you need support or information about quitting drugs.  Tell your health care provider if you often feel depressed.  Tell your health care provider if you have ever been abused or do not feel safe at home.   This information is not intended to replace advice given to you by your health care provider. Make sure you discuss any questions you have with your health care provider.   Document Released: 03/23/2011 Document Revised: 09/28/2014 Document Reviewed: 08/09/2013 Elsevier Interactive Patient Education [employer].

## 2015-08-30 LAB — CMP14+EGFR
ALBUMIN: 3.7 g/dL (ref 3.5–5.5)
ALK PHOS: 81 IU/L (ref 39–117)
ALT: 17 IU/L (ref 0–32)
AST: 17 IU/L (ref 0–40)
Albumin/Globulin Ratio: 1.4 (ref 1.1–2.5)
BILIRUBIN TOTAL: 0.3 mg/dL (ref 0.0–1.2)
BUN/Creatinine Ratio: 10 (ref 9–23)
BUN: 9 mg/dL (ref 6–24)
CHLORIDE: 102 mmol/L (ref 97–106)
CO2: 23 mmol/L (ref 18–29)
CREATININE: 0.94 mg/dL (ref 0.57–1.00)
Calcium: 8.6 mg/dL — ABNORMAL LOW (ref 8.7–10.2)
GFR calc Af Amer: 85 mL/min/{1.73_m2} (ref >59)
GFR calc non Af Amer: 74 mL/min/{1.73_m2} (ref >59)
GLUCOSE: 101 mg/dL — AB (ref 65–99)
Globulin, Total: 2.7 g/dL (ref 1.5–4.5)
Potassium: 4.4 mmol/L (ref 3.5–5.2)
Sodium: 138 mmol/L (ref 136–144)
Total Protein: 6.4 g/dL (ref 6.0–8.5)

## 2015-08-30 LAB — THYROID PANEL WITH TSH
Free Thyroxine Index: 2.2 (ref 1.2–4.9)
T3 Uptake Ratio: 30 % (ref 24–39)
T4 TOTAL: 7.4 ug/dL (ref 4.5–12.0)
TSH: 0.828 u[IU]/mL (ref 0.450–4.500)

## 2015-08-30 LAB — CBC WITH DIFFERENTIAL/PLATELET
BASOS: 0 %
Basophils Absolute: 0 10*3/uL (ref 0.0–0.2)
EOS (ABSOLUTE): 0.1 10*3/uL (ref 0.0–0.4)
EOS: 1 %
HEMATOCRIT: 42.7 % (ref 34.0–46.6)
HEMOGLOBIN: 14.4 g/dL (ref 11.1–15.9)
IMMATURE GRANULOCYTES: 0 %
Immature Grans (Abs): 0 10*3/uL (ref 0.0–0.1)
LYMPHS ABS: 2 10*3/uL (ref 0.7–3.1)
Lymphs: 19 %
MCH: 32.3 pg (ref 26.6–33.0)
MCHC: 33.7 g/dL (ref 31.5–35.7)
MCV: 96 fL (ref 79–97)
MONOCYTES: 4 %
MONOS ABS: 0.4 10*3/uL (ref 0.1–0.9)
Neutrophils Absolute: 7.8 10*3/uL — ABNORMAL HIGH (ref 1.4–7.0)
Neutrophils: 76 %
PLATELETS: 302 10*3/uL (ref 150–379)
RBC: 4.46 x10E6/uL (ref 3.77–5.28)
RDW: 13.4 % (ref 12.3–15.4)
WBC: 10.4 10*3/uL (ref 3.4–10.8)

## 2015-08-30 LAB — LIPID PANEL
CHOLESTEROL TOTAL: 194 mg/dL (ref 100–199)
Chol/HDL Ratio: 5.5 ratio units — ABNORMAL HIGH (ref 0.0–4.4)
HDL: 35 mg/dL — AB (ref >39)
LDL CALC: 134 mg/dL — AB (ref 0–99)
TRIGLYCERIDES: 126 mg/dL (ref 0–149)
VLDL CHOLESTEROL CAL: 25 mg/dL (ref 5–40)

## 2015-09-04 LAB — PAP IG W/ RFLX HPV ASCU: PAP Smear Comment: 0

## 2015-11-20 ENCOUNTER — Encounter: Payer: Self-pay | Admitting: Family Medicine

## 2015-11-20 ENCOUNTER — Ambulatory Visit (INDEPENDENT_AMBULATORY_CARE_PROVIDER_SITE_OTHER): Payer: BLUE CROSS/BLUE SHIELD | Admitting: Family Medicine

## 2015-11-20 VITALS — BP 150/93 | HR 90 | Temp 97.5°F | Ht 66.0 in | Wt 232.4 lb

## 2015-11-20 DIAGNOSIS — R6883 Chills (without fever): Secondary | ICD-10-CM

## 2015-11-20 DIAGNOSIS — R0981 Nasal congestion: Secondary | ICD-10-CM | POA: Diagnosis not present

## 2015-11-20 DIAGNOSIS — R05 Cough: Secondary | ICD-10-CM

## 2015-11-20 DIAGNOSIS — R52 Pain, unspecified: Secondary | ICD-10-CM

## 2015-11-20 DIAGNOSIS — R059 Cough, unspecified: Secondary | ICD-10-CM

## 2015-11-20 DIAGNOSIS — J209 Acute bronchitis, unspecified: Secondary | ICD-10-CM | POA: Diagnosis not present

## 2015-11-20 LAB — POCT INFLUENZA A/B
INFLUENZA A, POC: NEGATIVE
INFLUENZA B, POC: NEGATIVE

## 2015-11-20 MED ORDER — PREDNISONE 20 MG PO TABS: 40.0000 mg | ORAL_TABLET | Freq: Every day | ORAL | Status: DC

## 2015-11-20 MED ORDER — AZITHROMYCIN 250 MG PO TABS: ORAL_TABLET | ORAL | Status: DC

## 2015-11-20 MED ORDER — OSELTAMIVIR PHOSPHATE 75 MG PO CAPS: 75.0000 mg | ORAL_CAPSULE | Freq: Every day | ORAL | Status: DC

## 2015-11-20 NOTE — Patient Instructions (Signed)
Great to meet you!  Tamiflu is optional  Do take the azithromycin and prednisone (start prednisone tomorrow)  Call or come back with any concerns  Acute Bronchitis Bronchitis is when the airways that extend from the windpipe into the lungs get red, puffy, and painful (inflamed). Bronchitis often causes thick spit (mucus) to develop. This leads to a cough. A cough is the most common symptom of bronchitis. In acute bronchitis, the condition usually begins suddenly and goes away over time (usually in 2 weeks). Smoking, allergies, and asthma can make bronchitis worse. Repeated episodes of bronchitis may cause more lung problems. HOME CARE  Rest.  Drink enough fluids to keep your pee (urine) clear or pale yellow (unless you need to limit fluids as told by your doctor).  Only take over-the-counter or prescription medicines as told by your doctor.  Avoid smoking and secondhand smoke. These can make bronchitis worse. If you are a smoker, think about using nicotine gum or skin patches. Quitting smoking will help your lungs heal faster.  Reduce the chance of getting bronchitis again by:  Washing your hands often.  Avoiding people with cold symptoms.  Trying not to touch your hands to your mouth, nose, or eyes.  Follow up with your doctor as told. GET HELP IF: Your symptoms do not improve after 1 week of treatment. Symptoms include:  Cough.  Fever.  Coughing up thick spit.  Body aches.  Chest congestion.  Chills.  Shortness of breath.  Sore throat. GET HELP RIGHT AWAY IF:   You have an increased fever.  You have chills.  You have severe shortness of breath.  You have bloody thick spit (sputum).  You throw up (vomit) often.  You lose too much body fluid (dehydration).  You have a severe headache.  You faint. MAKE SURE YOU:   Understand these instructions.  Will watch your condition.  Will get help right away if you are not doing well or get worse.   This  information is not intended to replace advice given to you by your health care provider. Make sure you discuss any questions you have with your health care provider.   Document Released: 02/24/2008 Document Revised: 05/10/2013 Document Reviewed: 02/28/2013 Elsevier Interactive Patient Education [employer]

## 2015-11-20 NOTE — Progress Notes (Signed)
   HPI  Patient presents today here for acute visit.  Patient explains  she's had cough, chills, body aches, nasal congestion for a few days. Her initial symptoms of sore throat started about 4 days ago, almost 48 hours ago she developed myalgias and malaise. She has chills. Her daughter has been diagnosed with the flu this morning  She is tolerating food and fluids easily, no chest pain Some dyspnea  PMH: Smoking status noted ROS: Per HPI  Objective: BP 150/93 mmHg  Pulse 90  Temp(Src) 97.5 F (36.4 C) (Oral)  Ht  (1.676 m)  Wt 232 lb 6.4 oz (105.416 kg)  BMI 37.53 kg/m2 Gen: NAD, alert, cooperative with exam HEENT: NCAT, TMs normal bilaterally, nares with erythema and some swelling, oropharynx clear and moist CV: RRR, good S1/S2, no murmur Resp: CTABL, no wheezes, non-labored Ext: No edema, warm Neuro: Alert and oriented, No gross deficits  Assessment and plan:  # Acute bronchitis- considering smoker and severe cough Treat with azithromycin and prednisone. She has flu positive exposure in the house so we'll start prophylactic dose Tamiflu, her daughter was diagnosed the fluid on myself this morning. Supportive care Encouraged her to quit smoking.   Orders Placed This Encounter  Procedures  . POCT Influenza A/B    Meds ordered this encounter  Medications  . oseltamivir (TAMIFLU) 75 MG capsule    Sig: Take 1 capsule (75 mg total) by mouth daily.    Dispense:  10 capsule    Refill:  0  . predniSONE (DELTASONE) 20 MG tablet    Sig: Take 2 tablets (40 mg total) by mouth daily with breakfast.    Dispense:  10 tablet    Refill:  0  . azithromycin (ZITHROMAX) 250 MG tablet    Sig: Take 2 tablets on day 1 and 1 tablet daily after that    Dispense:  6 tablet    Refill:  0    Murtis Sink, MD [facility] 11/20/2015, 6:12 PM

## 2015-11-22 ENCOUNTER — Telehealth: Payer: Self-pay | Admitting: Family Medicine

## 2015-11-22 NOTE — Telephone Encounter (Signed)
Pt was seen with Dr.Bradshaw 11/20/15 and her daughter was dx'd with the flu, pt was told to CB if her son or husband wanted the Tamiflu sent in. Tamiflu sent in to pharmacy.

## 2016-01-30 ENCOUNTER — Encounter: Payer: Self-pay | Admitting: Family

## 2016-01-30 ENCOUNTER — Ambulatory Visit (INDEPENDENT_AMBULATORY_CARE_PROVIDER_SITE_OTHER): Payer: BLUE CROSS/BLUE SHIELD | Admitting: Family

## 2016-01-30 VITALS — BP 175/118 | HR 87 | Temp 99.2°F | Ht 66.0 in | Wt 233.0 lb

## 2016-01-30 DIAGNOSIS — F17200 Nicotine dependence, unspecified, uncomplicated: Secondary | ICD-10-CM | POA: Insufficient documentation

## 2016-01-30 DIAGNOSIS — Z1239 Encounter for other screening for malignant neoplasm of breast: Secondary | ICD-10-CM

## 2016-01-30 DIAGNOSIS — I10 Essential (primary) hypertension: Secondary | ICD-10-CM | POA: Diagnosis not present

## 2016-01-30 DIAGNOSIS — E669 Obesity, unspecified: Secondary | ICD-10-CM | POA: Insufficient documentation

## 2016-01-30 DIAGNOSIS — Z720 Tobacco use: Secondary | ICD-10-CM

## 2016-01-30 MED ORDER — LISINOPRIL-HYDROCHLOROTHIAZIDE 20-12.5 MG PO TABS: 1.0000 | ORAL_TABLET | Freq: Every day | ORAL | Status: DC

## 2016-01-30 NOTE — Progress Notes (Signed)
   Subjective:    Patient ID: Michelle Blake, female    DOB: 1971/02/16, 45 y.o.   MRN: [id number]  Headache  Pertinent negatives include no blurred vision. Her past medical history is significant for hypertension.  Hypertension This is a new problem. The current episode started more than 1 year ago. The problem is unchanged. The problem is uncontrolled. Associated symptoms include headaches, malaise/fatigue and palpitations. Pertinent negatives include no blurred vision, chest pain, peripheral edema or shortness of breath. Risk factors for coronary artery disease include obesity, smoking/tobacco exposure, sedentary lifestyle, stress and family history. Past treatments include nothing. The current treatment provides no improvement. There is no history of kidney disease, CAD/MI, CVA, heart failure or a thyroid problem.      Review of Systems  Constitutional: Positive for malaise/fatigue.  Eyes: Negative for blurred vision.  Respiratory: Negative for shortness of breath.   Cardiovascular: Positive for palpitations. Negative for chest pain.  Neurological: Positive for headaches.  All other systems reviewed and are negative.      Objective:   Physical Exam  Constitutional: She is oriented to person, place, and time. She appears well-developed and well-nourished. No distress.  HENT:  Head: Normocephalic and atraumatic.  Eyes: Pupils are equal, round, and reactive to light.  Neck: Normal range of motion. Neck supple. No thyromegaly present.  Cardiovascular: Normal rate, regular rhythm, normal heart sounds and intact distal pulses.   No murmur heard. Pulmonary/Chest: Effort normal and breath sounds normal. No respiratory distress. She has no wheezes.  Abdominal: Soft. Bowel sounds are normal. She exhibits no distension. There is no tenderness.  Musculoskeletal: Normal range of motion. She exhibits no edema or tenderness.  Neurological: She is alert and oriented to person, place, and time.    Skin: Skin is warm and dry.  Psychiatric: She has a normal mood and affect. Her behavior is normal. Judgment and thought content normal.  Vitals reviewed.   BP 175/118 mmHg  Pulse 87  Temp(Src) 99.2 F (37.3 C) (Oral)  Ht '5\' 6"'$  (1.676 m)  Wt 233 lb (105.688 kg)  BMI 37.63 kg/m2       Assessment & Plan:  1. Essential hypertension -Pt started on Zestoretic 20/12.5 mg today -Dash diet information given -Exercise encouraged - Stress Management  -Continue current meds -RTO in 2 weeks - lisinopril-hydrochlorothiazide (ZESTORETIC) 20-12.5 MG tablet; Take 1 tablet by mouth daily.  Dispense: 90 tablet; Refill: 3 - CMP14+EGFR  2. Obesity (BMI 30-39.9) - CMP14+EGFR  3. Current smoker -Smoking cessation discussed  4. Breast cancer screening - [facility]MOGRAP[facility]   Continue all meds Labs pending Health Maintenance reviewed Diet and exercise encouraged RTO 10 days  Evelina Dun, FNP

## 2016-01-30 NOTE — Patient Instructions (Signed)
DASH Eating Plan °DASH stands for "Dietary Approaches to Stop Hypertension." The DASH eating plan is a healthy eating plan that has been shown to reduce high blood pressure (hypertension). Additional health benefits may include reducing the risk of type 2 diabetes mellitus, heart disease, and stroke. The DASH eating plan may also help with weight loss. °WHAT DO I NEED TO KNOW ABOUT THE DASH EATING PLAN? °For the DASH eating plan, you will follow these general guidelines: °· Choose foods with a percent daily value for sodium of less than 5% (as listed on the food label). °· Use salt-free seasonings or herbs instead of table salt or sea salt. °· Check with your health care provider or pharmacist before using salt substitutes. °· Eat lower-sodium products, often labeled as "lower sodium" or "no salt added." °· Eat fresh foods. °· Eat more vegetables, fruits, and low-fat dairy products. °· Choose whole grains. Look for the word "whole" as the first word in the ingredient list. °· Choose fish and skinless chicken or turkey more often than red meat. Limit fish, poultry, and meat to 6 oz (170 g) each day. °· Limit sweets, desserts, sugars, and sugary drinks. °· Choose heart-healthy fats. °· Limit cheese to 1 oz (28 g) per day. °· Eat more home-cooked food and less restaurant, buffet, and fast food. °· Limit fried foods. °· Cook foods using methods other than frying. °· Limit canned vegetables. If you do use them, rinse them well to decrease the sodium. °· When eating at a restaurant, ask that your food be prepared with less salt, or no salt if possible. °WHAT FOODS CAN I EAT? °Seek help from a dietitian for individual calorie needs. °Grains °Whole grain or whole wheat bread. Brown rice. Whole grain or whole wheat pasta. Quinoa, bulgur, and whole grain cereals. Low-sodium cereals. Corn or whole wheat flour tortillas. Whole grain cornbread. Whole grain crackers. Low-sodium crackers. °Vegetables °Fresh or frozen vegetables  (raw, steamed, roasted, or grilled). Low-sodium or reduced-sodium tomato and vegetable juices. Low-sodium or reduced-sodium tomato sauce and paste. Low-sodium or reduced-sodium canned vegetables.  °Fruits °All fresh, canned (in natural juice), or frozen fruits. °Meat and Other Protein Products °Ground beef (85% or leaner), grass-fed beef, or beef trimmed of fat. Skinless chicken or turkey. Ground chicken or turkey. Pork trimmed of fat. All fish and seafood. Eggs. Dried beans, peas, or lentils. Unsalted nuts and seeds. Unsalted canned beans. °Dairy °Low-fat dairy products, such as skim or 1% milk, 2% or reduced-fat cheeses, low-fat ricotta or cottage cheese, or plain low-fat yogurt. Low-sodium or reduced-sodium cheeses. °Fats and Oils °Tub margarines without trans fats. Light or reduced-fat mayonnaise and salad dressings (reduced sodium). Avocado. Safflower, olive, or canola oils. Natural peanut or almond butter. °Other °Unsalted popcorn and pretzels. °The items listed above may not be a complete list of recommended foods or beverages. Contact your dietitian for more options. °WHAT FOODS ARE NOT RECOMMENDED? °Grains °White bread. White pasta. White rice. Refined cornbread. Bagels and croissants. Crackers that contain trans fat. °Vegetables °Creamed or fried vegetables. Vegetables in a cheese sauce. Regular canned vegetables. Regular canned tomato sauce and paste. Regular tomato and vegetable juices. °Fruits °Dried fruits. Canned fruit in light or heavy syrup. Fruit juice. °Meat and Other Protein Products °Fatty cuts of meat. Ribs, chicken wings, bacon, sausage, bologna, salami, chitterlings, fatback, hot dogs, bratwurst, and packaged luncheon meats. Salted nuts and seeds. Canned beans with salt. °Dairy °Whole or 2% milk, cream, half-and-half, and cream cheese. Whole-fat or sweetened yogurt. Full-fat   cheeses or blue cheese. Nondairy creamers and whipped toppings. Processed cheese, cheese spreads, or cheese  curds. °Condiments °Onion and garlic salt, seasoned salt, table salt, and sea salt. Canned and packaged gravies. Worcestershire sauce. Tartar sauce. Barbecue sauce. Teriyaki sauce. Soy sauce, including reduced sodium. Steak sauce. Fish sauce. Oyster sauce. Cocktail sauce. Horseradish. Ketchup and mustard. Meat flavorings and tenderizers. Bouillon cubes. Hot sauce. Tabasco sauce. Marinades. Taco seasonings. Relishes. °Fats and Oils °Butter, stick margarine, lard, shortening, ghee, and bacon fat. Coconut, palm kernel, or palm oils. Regular salad dressings. °Other °Pickles and olives. Salted popcorn and pretzels. °The items listed above may not be a complete list of foods and beverages to avoid. Contact your dietitian for more information. °WHERE CAN I FIND MORE INFORMATION? °National Heart, Lung, and Blood Institute: www.nhlbi.nih.gov/health/health-topics/topics/dash/ °  °This information is not intended to replace advice given to you by your health care provider. Make sure you discuss any questions you have with your health care provider. °  °Document Released: 08/27/2011 Document Revised: 09/28/2014 Document Reviewed: 07/12/2013 °Elsevier Interactive Patient Education ©2016 Elsevier Inc. ° °Hypertension °Hypertension, commonly called high blood pressure, is when the force of blood pumping through your arteries is too strong. Your arteries are the blood vessels that carry blood from your heart throughout your body. A blood pressure reading consists of a higher number over a lower number, such as 110/72. The higher number (systolic) is the pressure inside your arteries when your heart pumps. The lower number (diastolic) is the pressure inside your arteries when your heart relaxes. Ideally you want your blood pressure below 120/80. °Hypertension forces your heart to work harder to pump blood. Your arteries may become narrow or stiff. Having untreated or uncontrolled hypertension can cause heart attack, stroke, kidney  disease, and other problems. °RISK FACTORS °Some risk factors for high blood pressure are controllable. Others are not.  °Risk factors you cannot control include:  °· Race. You may be at higher risk if you are African American. °· Age. Risk increases with age. °· Gender. Men are at higher risk than women before age 45 years. After age 65, women are at higher risk than men. °Risk factors you can control include: °· Not getting enough exercise or physical activity. °· Being overweight. °· Getting too much fat, sugar, calories, or salt in your diet. °· Drinking too much alcohol. °SIGNS AND SYMPTOMS °Hypertension does not usually cause signs or symptoms. Extremely high blood pressure (hypertensive crisis) may cause headache, anxiety, shortness of breath, and nosebleed. °DIAGNOSIS °To check if you have hypertension, your health care provider will measure your blood pressure while you are seated, with your arm held at the level of your heart. It should be measured at least twice using the same arm. Certain conditions can cause a difference in blood pressure between your right and left arms. A blood pressure reading that is higher than normal on one occasion does not mean that you need treatment. If it is not clear whether you have high blood pressure, you may be asked to return on a different day to have your blood pressure checked again. Or, you may be asked to monitor your blood pressure at home for 1 or more weeks. °TREATMENT °Treating high blood pressure includes making lifestyle changes and possibly taking medicine. Living a healthy lifestyle can help lower high blood pressure. You may need to change some of your habits. °Lifestyle changes may include: °· Following the DASH diet. This diet is high in fruits, vegetables, and whole   grains. It is low in salt, red meat, and added sugars. °· Keep your sodium intake below 2,300 mg per day. °· Getting at least 30-45 minutes of aerobic exercise at least 4 times per  week. °· Losing weight if necessary. °· Not smoking. °· Limiting alcoholic beverages. °· Learning ways to reduce stress. °Your health care provider may prescribe medicine if lifestyle changes are not enough to get your blood pressure under control, and if one of the following is true: °· You are 18-59 years of age and your systolic blood pressure is above 140. °· You are 60 years of age or older, and your systolic blood pressure is above 150. °· Your diastolic blood pressure is above 90. °· You have diabetes, and your systolic blood pressure is over 140 or your diastolic blood pressure is over 90. °· You have kidney disease and your blood pressure is above 140/90. °· You have heart disease and your blood pressure is above 140/90. °Your personal target blood pressure may vary depending on your medical conditions, your age, and other factors. °HOME CARE INSTRUCTIONS °· Have your blood pressure rechecked as directed by your health care provider.   °· Take medicines only as directed by your health care provider. Follow the directions carefully. Blood pressure medicines must be taken as prescribed. The medicine does not work as well when you skip doses. Skipping doses also puts you at risk for problems. °· Do not smoke.   °· Monitor your blood pressure at home as directed by your health care provider.  °SEEK MEDICAL CARE IF:  °· You think you are having a reaction to medicines taken. °· You have recurrent headaches or feel dizzy. °· You have swelling in your ankles. °· You have trouble with your vision. °SEEK IMMEDIATE MEDICAL CARE IF: °· You develop a severe headache or confusion. °· You have unusual weakness, numbness, or feel faint. °· You have severe chest or abdominal pain. °· You vomit repeatedly. °· You have trouble breathing. °MAKE SURE YOU:  °· Understand these instructions. °· Will watch your condition. °· Will get help right away if you are not doing well or get worse. °  °This information is not intended to  replace advice given to you by your health care provider. Make sure you discuss any questions you have with your health care provider. °  °Document Released: 09/07/2005 Document Revised: 01/22/2015 Document Reviewed: 06/30/2013 °Elsevier Interactive Patient Education ©2016 Elsevier Inc. ° °

## 2016-02-01 LAB — CMP14+EGFR
A/G RATIO: 1.5 (ref 1.2–2.2)
ALT: 12 IU/L (ref 0–32)
AST: 16 IU/L (ref 0–40)
Albumin: 4.1 g/dL (ref 3.5–5.5)
Alkaline Phosphatase: 83 IU/L (ref 39–117)
BUN/Creatinine Ratio: 9 (ref 9–23)
BUN: 9 mg/dL (ref 6–24)
Bilirubin Total: <0.2 mg/dL (ref 0.0–1.2)
CALCIUM: 8.7 mg/dL (ref 8.7–10.2)
CO2: 21 mmol/L (ref 18–29)
CREATININE: 1 mg/dL (ref 0.57–1.00)
Chloride: 101 mmol/L (ref 96–106)
GFR, EST AFRICAN AMERICAN: 79 mL/min/{1.73_m2} (ref >59)
GFR, EST NON AFRICAN AMERICAN: 69 mL/min/{1.73_m2} (ref >59)
GLUCOSE: 86 mg/dL (ref 65–99)
Globulin, Total: 2.8 g/dL (ref 1.5–4.5)
POTASSIUM: 4.5 mmol/L (ref 3.5–5.2)
Sodium: 137 mmol/L (ref 134–144)
TOTAL PROTEIN: 6.9 g/dL (ref 6.0–8.5)

## 2016-02-12 ENCOUNTER — Encounter: Payer: Self-pay | Admitting: Physician Assistant

## 2016-02-12 ENCOUNTER — Ambulatory Visit (INDEPENDENT_AMBULATORY_CARE_PROVIDER_SITE_OTHER): Payer: BLUE CROSS/BLUE SHIELD | Admitting: Physician Assistant

## 2016-02-12 ENCOUNTER — Ambulatory Visit (INDEPENDENT_AMBULATORY_CARE_PROVIDER_SITE_OTHER): Payer: BLUE CROSS/BLUE SHIELD

## 2016-02-12 VITALS — BP 139/93 | HR 119 | Temp 97.5°F | Ht 66.0 in | Wt 233.4 lb

## 2016-02-12 DIAGNOSIS — M79671 Pain in right foot: Secondary | ICD-10-CM | POA: Diagnosis not present

## 2016-02-12 NOTE — Progress Notes (Signed)
Subjective:     Patient ID: Michelle ReefSandra M Blake, female   DOB: 09/25/1970, 45 y.o.   MRN: [id number]  HPI Pt slipped in the rain and hyperext foot No with pain to the distal R foot  Review of Systems Denies numbness to the toes + swelling and bruising Sx worse with weight bearing/walking    Objective:   Physical Exam NAD + ecchy/edema to the distal R foot Good pulses/sensroy + TTP along the distal 1-2nd metatarsal area No TTP along the ankle FROM of the ankle Xray foot- no acute fx seen    Assessment:     1. Foot pain, right        Plan:     Heat/Ice OTC NSAIDS Nl course reviewed Rx for Post Op shoe F/U prn

## 2016-02-12 NOTE — Patient Instructions (Signed)

## 2016-07-17 ENCOUNTER — Encounter: Payer: Self-pay | Admitting: Physician Assistant

## 2016-07-17 ENCOUNTER — Ambulatory Visit (INDEPENDENT_AMBULATORY_CARE_PROVIDER_SITE_OTHER): Payer: BLUE CROSS/BLUE SHIELD | Admitting: Physician Assistant

## 2016-07-17 VITALS — BP 138/82 | HR 112 | Ht 66.0 in | Wt 231.8 lb

## 2016-07-17 DIAGNOSIS — J209 Acute bronchitis, unspecified: Secondary | ICD-10-CM

## 2016-07-17 DIAGNOSIS — J029 Acute pharyngitis, unspecified: Secondary | ICD-10-CM | POA: Diagnosis not present

## 2016-07-17 DIAGNOSIS — R059 Cough, unspecified: Secondary | ICD-10-CM

## 2016-07-17 DIAGNOSIS — R05 Cough: Secondary | ICD-10-CM | POA: Diagnosis not present

## 2016-07-17 LAB — RAPID STREP SCREEN (MED CTR MEBANE ONLY): STREP GP A AG, IA W/REFLEX: NEGATIVE

## 2016-07-17 LAB — CULTURE, GROUP A STREP

## 2016-07-17 MED ORDER — BENZONATATE 200 MG PO CAPS: 200.0000 mg | ORAL_CAPSULE | Freq: Three times a day (TID) | ORAL | 0 refills | Status: DC | PRN

## 2016-07-17 MED ORDER — FLUCONAZOLE 150 MG PO TABS: 150.0000 mg | ORAL_TABLET | Freq: Once | ORAL | 0 refills | Status: AC

## 2016-07-17 MED ORDER — AZITHROMYCIN 250 MG PO TABS: ORAL_TABLET | ORAL | 0 refills | Status: DC

## 2016-07-17 NOTE — Progress Notes (Signed)
BP (!) 142/89   Pulse (!) 111   Ht 5\' 6"  (1.676 m)   Wt 231 lb 12.8 oz (105.1 kg)   BMI 37.41 kg/m    Subjective:    Patient ID: Michelle Blake, female    DOB: [birth year], 45 y.o.   MRN: [id number]  HPI: Michelle Blake is a 45 y.o. female presenting on 07/17/2016 for Sore Throat; Cough; Nasal Congestion; and Ear Pain (left)  Several days congestion and drainage. Some chills, denies fever. No NVD. No known exposure except she works in a loan center and customers come in. Has past history of bronchitis, is a smoker.  Relevant past medical, surgical, family and social history reviewed and updated as indicated. Allergies and medications reviewed and updated.   Past Surgical History:  Procedure Laterality Date  . FOOT SURGERY      Review of Systems  Constitutional: Positive for chills and fatigue. Negative for fever.  HENT: Positive for congestion, postnasal drip and sore throat.   Eyes: Negative.   Respiratory: Positive for cough and wheezing.   Gastrointestinal: Negative.   Genitourinary: Negative.       Medication List       Accurate as of 07/17/16 10:14 AM. Always use your most recent med list.          azithromycin 250 MG tablet Commonly known as:  ZITHROMAX Z-PAK As directed   benzonatate 200 MG capsule Commonly known as:  TESSALON Take 1 capsule (200 mg total) by mouth 3 (three) times daily as needed for cough.   fluconazole 150 MG tablet Commonly known as:  DIFLUCAN Take 1 tablet (150 mg total) by mouth once.          Objective:    BP (!) 142/89   Pulse (!) 111   Ht 5\' 6"  (1.676 m)   Wt 231 lb 12.8 oz (105.1 kg)   BMI 37.41 kg/m   No Known Allergies  Physical Exam  Constitutional: She is oriented to person, place, and time. She appears well-developed and well-nourished.  HENT:  Head: Normocephalic and atraumatic.  Right Ear: There is drainage and tenderness.  Left Ear: There is drainage and tenderness.  Nose: Mucosal edema and  rhinorrhea present. Right sinus exhibits maxillary sinus tenderness and frontal sinus tenderness. Left sinus exhibits maxillary sinus tenderness and frontal sinus tenderness.  Mouth/Throat: Oropharyngeal exudate and posterior oropharyngeal erythema present.  Eyes: Conjunctivae and EOM are normal. Pupils are equal, round, and reactive to light.  Neck: Normal range of motion. Neck supple.  Cardiovascular: Normal rate, regular rhythm, normal heart sounds and intact distal pulses.   Pulmonary/Chest: Effort normal. She has wheezes in the right upper field and the left upper field.  Abdominal: Soft. Bowel sounds are normal.  Neurological: She is alert and oriented to person, place, and time. She has normal reflexes.  Skin: Skin is warm and dry. No rash noted.  Psychiatric: She has a normal mood and affect. Her behavior is normal. Judgment and thought content normal.       Assessment & Plan:   1. Sore throat - Rapid strep screen (not at [facility])  2. Acute bronchitis, unspecified organism - azithromycin (ZITHROMAX Z-PAK) 250 MG tablet; As directed  Dispense: 6 tablet; Refill: 0 - fluconazole (DIFLUCAN) 150 MG tablet; Take 1 tablet (150 mg total) by mouth once.  Dispense: 1 tablet; Refill: 0  3. Cough - benzonatate (TESSALON) 200 MG capsule; Take 1 capsule (200 mg total) by mouth 3 (three) times  daily as needed for cough.  Dispense: 30 capsule; Refill: 0   Continue all other maintenance medications as listed above.  Follow up plan: Return if symptoms worsen or fail to improve.  Orders Placed This Encounter  Procedures  . Rapid strep screen (not at [facility])    Educational handout given for bronchitis   Remus LofflerAngel S. Jeraline Marcinek PA-C [facility] [address]  [place], [place] [id number] [phone]   07/17/2016, 10:14 AM

## 2016-07-17 NOTE — Patient Instructions (Signed)

## 2016-10-29 ENCOUNTER — Telehealth: Payer: Self-pay | Admitting: Nurse Practitioner

## 2016-10-30 NOTE — Telephone Encounter (Signed)
Left message for patient to call .  (How many years has this mirena been in? Medicine works for 5 years).

## 2016-10-30 NOTE — Telephone Encounter (Signed)
Appt given

## 2016-11-20 ENCOUNTER — Ambulatory Visit (INDEPENDENT_AMBULATORY_CARE_PROVIDER_SITE_OTHER): Payer: BLUE CROSS/BLUE SHIELD | Admitting: Nurse Practitioner

## 2016-11-20 ENCOUNTER — Encounter: Payer: Self-pay | Admitting: Nurse Practitioner

## 2016-11-20 VITALS — BP 132/88 | HR 102 | Temp 97.9°F | Ht 66.0 in | Wt 232.0 lb

## 2016-11-20 DIAGNOSIS — Z0000 Encounter for general adult medical examination without abnormal findings: Secondary | ICD-10-CM | POA: Diagnosis not present

## 2016-11-20 DIAGNOSIS — Z01419 Encounter for gynecological examination (general) (routine) without abnormal findings: Secondary | ICD-10-CM

## 2016-11-20 DIAGNOSIS — Z30433 Encounter for removal and reinsertion of intrauterine contraceptive device: Secondary | ICD-10-CM

## 2016-11-20 DIAGNOSIS — Z30430 Encounter for insertion of intrauterine contraceptive device: Secondary | ICD-10-CM

## 2016-11-20 LAB — URINALYSIS
BILIRUBIN UA: NEGATIVE
GLUCOSE, UA: NEGATIVE
Nitrite, UA: NEGATIVE
RBC, UA: NEGATIVE
Specific Gravity, UA: 1.025 (ref 1.005–1.030)
UUROB: 0.2 mg/dL (ref 0.2–1.0)
pH, UA: 5 (ref 5.0–7.5)

## 2016-11-20 MED ORDER — LEVONORGESTREL 20 MCG/24HR IU IUD
INTRAUTERINE_SYSTEM | Freq: Once | INTRAUTERINE | Status: AC
  Administered 2016-11-20: 15:00:00 via INTRAUTERINE

## 2016-11-20 NOTE — Progress Notes (Signed)
   Subjective:    Patient ID: Michelle Blake, female    DOB: 12-20-1970, 46 y.o.   MRN: [id number]  HPI Patient comes in today for annual physical exam, PAP, removal and insertion of mirena. She is doing well today without complaints. SHe has a history of hypertension but is currently not on any meds.    Review of Systems  Constitutional: Negative.  Negative for diaphoresis.  HENT: Negative.   Eyes: Negative for pain.  Respiratory: Negative.  Negative for shortness of breath.   Cardiovascular: Negative.  Negative for chest pain, palpitations and leg swelling.  Gastrointestinal: Negative.  Negative for abdominal pain.  Endocrine: Negative for polydipsia.  Genitourinary: Negative.   Musculoskeletal: Negative.   Skin: Negative for rash.  Neurological: Negative.  Negative for dizziness, weakness and headaches.  Hematological: Negative.  Does not bruise/bleed easily.  Psychiatric/Behavioral: Negative.        Objective:   Physical Exam  Constitutional: She is oriented to person, place, and time. She appears well-developed and well-nourished.  HENT:  Head: Normocephalic.  Right Ear: Hearing, tympanic membrane, external ear and ear canal normal.  Left Ear: Hearing, tympanic membrane, external ear and ear canal normal.  Nose: Nose normal.  Mouth/Throat: Uvula is midline and oropharynx is clear and moist.  Eyes: Conjunctivae and EOM are normal. Pupils are equal, round, and reactive to light.  Neck: Normal range of motion and full passive range of motion without pain. Neck supple. No JVD present. Carotid bruit is not present. No thyroid mass and no thyromegaly present.  Cardiovascular: Normal rate, normal heart sounds and intact distal pulses.   No murmur heard. Pulmonary/Chest: Effort normal and breath sounds normal. Right breast exhibits no inverted nipple, no mass, no nipple discharge, no skin change and no tenderness. Left breast exhibits no inverted nipple, no mass, no nipple  discharge, no skin change and no tenderness.  Abdominal: Soft. Bowel sounds are normal. She exhibits no mass. There is no tenderness.  Genitourinary: Vagina normal and uterus normal. No breast swelling, tenderness, discharge or bleeding. No vaginal discharge found.  Genitourinary Comments: bimanual exam-No adnexal masses or tenderness. Cervix parous and pink  Musculoskeletal: Normal range of motion.  Lymphadenopathy:    She has no cervical adenopathy.  Neurological: She is alert and oriented to person, place, and time.  Skin: Skin is warm and dry.  Psychiatric: She has a normal mood and affect. Her behavior is normal. Judgment and thought content normal.   BP 132/88   Pulse (!) 102   Temp 97.9 F (36.6 C) (Oral)   Ht 5' 6" (1.676 m)   Wt 232 lb (105.2 kg)   BMI 37.45 kg/m   mirena removed- intact Procedure:  Lithotomy position  larde speculum inserted  Betadine to cervical os  Single tooth tenaculum to ant lip of cervix  Sound measures 7.5  mirena inserted without complications  Tenaculum and speculum removed  Patient tolerated well       Assessment & Plan:   1. Annual physical exam   2. Gynecologic exam normal   3. Encounter for IUD insertion    Reviewed mirena side effects Orders Placed This Encounter  Procedures  . Urinalysis  . CMP14+EGFR  . CBC with Differential/Platelet  . Lipid panel  . Thyroid Panel With TSH   Labs ending 'diet and exercise encouraged Follow up in 1 year  [place], FNP

## 2016-11-20 NOTE — Patient Instructions (Signed)

## 2016-11-21 LAB — CMP14+EGFR
ALT: 13 IU/L (ref 0–32)
AST: 14 IU/L (ref 0–40)
Albumin/Globulin Ratio: 1.6 (ref 1.2–2.2)
Albumin: 4.1 g/dL (ref 3.5–5.5)
Alkaline Phosphatase: 80 IU/L (ref 39–117)
BUN/Creatinine Ratio: 11 (ref 9–23)
BUN: 10 mg/dL (ref 6–24)
Bilirubin Total: 0.3 mg/dL (ref 0.0–1.2)
CALCIUM: 8.7 mg/dL (ref 8.7–10.2)
CO2: 24 mmol/L (ref 18–29)
CREATININE: 0.94 mg/dL (ref 0.57–1.00)
Chloride: 100 mmol/L (ref 96–106)
GFR, EST AFRICAN AMERICAN: 85 mL/min/{1.73_m2} (ref >59)
GFR, EST NON AFRICAN AMERICAN: 74 mL/min/{1.73_m2} (ref >59)
GLUCOSE: 95 mg/dL (ref 65–99)
Globulin, Total: 2.6 g/dL (ref 1.5–4.5)
Potassium: 4.4 mmol/L (ref 3.5–5.2)
Sodium: 137 mmol/L (ref 134–144)
TOTAL PROTEIN: 6.7 g/dL (ref 6.0–8.5)

## 2016-11-21 LAB — LIPID PANEL
CHOLESTEROL TOTAL: 205 mg/dL — AB (ref 100–199)
Chol/HDL Ratio: 6 ratio units — ABNORMAL HIGH (ref 0.0–4.4)
HDL: 34 mg/dL — AB (ref >39)
LDL Calculated: 136 mg/dL — ABNORMAL HIGH (ref 0–99)
Triglycerides: 175 mg/dL — ABNORMAL HIGH (ref 0–149)
VLDL CHOLESTEROL CAL: 35 mg/dL (ref 5–40)

## 2016-11-21 LAB — CBC WITH DIFFERENTIAL/PLATELET
Basophils Absolute: 0 10*3/uL (ref 0.0–0.2)
Basos: 0 %
EOS (ABSOLUTE): 0.1 10*3/uL (ref 0.0–0.4)
Eos: 1 %
Hematocrit: 41.9 % (ref 34.0–46.6)
Hemoglobin: 13.8 g/dL (ref 11.1–15.9)
IMMATURE GRANS (ABS): 0 10*3/uL (ref 0.0–0.1)
Immature Granulocytes: 0 %
LYMPHS: 26 %
Lymphocytes Absolute: 3.3 10*3/uL — ABNORMAL HIGH (ref 0.7–3.1)
MCH: 31.1 pg (ref 26.6–33.0)
MCHC: 32.9 g/dL (ref 31.5–35.7)
MCV: 94 fL (ref 79–97)
MONOCYTES: 6 %
Monocytes Absolute: 0.7 10*3/uL (ref 0.1–0.9)
NEUTROS ABS: 8.3 10*3/uL — AB (ref 1.4–7.0)
Neutrophils: 67 %
Platelets: 299 10*3/uL (ref 150–379)
RBC: 4.44 x10E6/uL (ref 3.77–5.28)
RDW: 13.5 % (ref 12.3–15.4)
WBC: 12.6 10*3/uL — ABNORMAL HIGH (ref 3.4–10.8)

## 2016-11-21 LAB — THYROID PANEL WITH TSH
Free Thyroxine Index: 1.8 (ref 1.2–4.9)
T3 Uptake Ratio: 25 % (ref 24–39)
T4 TOTAL: 7.1 ug/dL (ref 4.5–12.0)
TSH: 0.911 u[IU]/mL (ref 0.450–4.500)

## 2016-11-25 LAB — IGP, APTIMA HPV, RFX 16/18,45
HPV APTIMA: NEGATIVE
PAP Smear Comment: 0

## 2017-12-05 DIAGNOSIS — J069 Acute upper respiratory infection, unspecified: Secondary | ICD-10-CM | POA: Diagnosis not present

## 2017-12-05 DIAGNOSIS — R300 Dysuria: Secondary | ICD-10-CM | POA: Diagnosis not present

## 2017-12-05 DIAGNOSIS — R34 Anuria and oliguria: Secondary | ICD-10-CM | POA: Diagnosis not present

## 2018-03-03 ENCOUNTER — Encounter: Payer: Self-pay | Admitting: Pediatrics

## 2018-03-03 ENCOUNTER — Ambulatory Visit (INDEPENDENT_AMBULATORY_CARE_PROVIDER_SITE_OTHER): Payer: BLUE CROSS/BLUE SHIELD | Admitting: Pediatrics

## 2018-03-03 VITALS — BP 153/101 | HR 82 | Temp 97.9°F | Ht 66.0 in | Wt 235.0 lb

## 2018-03-03 DIAGNOSIS — I10 Essential (primary) hypertension: Secondary | ICD-10-CM

## 2018-03-03 DIAGNOSIS — J4530 Mild persistent asthma, uncomplicated: Secondary | ICD-10-CM | POA: Diagnosis not present

## 2018-03-03 DIAGNOSIS — R062 Wheezing: Secondary | ICD-10-CM | POA: Diagnosis not present

## 2018-03-03 MED ORDER — LOSARTAN POTASSIUM 50 MG PO TABS: 50.0000 mg | ORAL_TABLET | Freq: Every day | ORAL | 0 refills | Status: DC

## 2018-03-03 MED ORDER — BUDESONIDE-FORMOTEROL FUMARATE 80-4.5 MCG/ACT IN AERO: 2.0000 | INHALATION_SPRAY | Freq: Two times a day (BID) | RESPIRATORY_TRACT | 2 refills | Status: DC

## 2018-03-03 NOTE — Patient Instructions (Signed)
Take blood pressures at home, bring numbers to clinic  Use albuterol for wheezing and coughing  Use symbicort two puffs twice a day until we see you back

## 2018-03-03 NOTE — Progress Notes (Signed)
  Subjective:   Patient ID: Michelle Blake, female    DOB: 06/20/1971, 47 y.o.   MRN: [id number] CC: Cough  HPI: Michelle ReefSandra M Staebell is a 47 y.o. female   Tried mucinex, OTC robitussin, tessalon pearles. Seen at urgent care 3 months ago, treated with steroids and cefdinir for possible UTI, had no improvement in symptoms.  Sometimes gets lightheaded with the coughing.  Does keep her awake at night.  Cough is nonproductive.  Smoking daily now.  Has had elevated blood pressures off and on for several years.  Not taking any medicines right now.  She does have albuterol at home.  She says that she has tried it, has not helped with her cough.  No shortness of breath, chest pain or headaches.  Patient has IUD.  Is not planning any further pregnancies.    Relevant past medical, surgical, family and social history reviewed. Allergies and medications reviewed and updated. Social History   Tobacco Use  Smoking Status Current Every Day Smoker  . Packs/day: 0.50  Smokeless Tobacco Never Used   ROS: Per HPI   Objective:    BP (!) 153/101   Pulse 82   Temp 97.9 F (36.6 C) (Oral)   Ht 5\' 6"  (1.676 m)   Wt 235 lb (106.6 kg)   BMI 37.93 kg/m   Wt Readings from Last 3 Encounters:  03/03/18 235 lb (106.6 kg)  11/20/16 232 lb (105.2 kg)  07/17/16 231 lb 12.8 oz (105.1 kg)    Gen: NAD, alert, cooperative with exam, NCAT EYES: EOMI, no conjunctival injection, or no icterus ENT:  TMs pearly gray b/l, OP with mild erythema LYMPH: no cervical LAD CV: NRRR, normal S1/S2, no murmur, distal pulses 2+ b/l Resp: Dry cough present.  Slight bilateral wheeze with forced exhalation.  No crackles.  Normal work of breathing Ext: No edema, warm Neuro: Alert and oriented  Assessment & Plan:  Dois DavenportSandra was seen today for cough.  Diagnoses and all orders for this visit:  Essential hypertension Uncontrolled, start below.  Check blood pressures at home.  Follow-up in 1 to 2 weeks with PCP for repeat BMP and  blood pressure visit. -     losartan (COZAAR) 50 MG tablet; Take 1 tablet (50 mg total) by mouth daily. -     Basic Metabolic Panel  Wheezing  Mild persistent reactive airway disease without complication Use albuterol for cough as needed.  Start below. -     budesonide-formoterol (SYMBICORT) 80-4.5 MCG/ACT inhaler; Inhale 2 puffs into the lungs 2 (two) times daily.   Follow up plan: Return in about 11 days (around 03/14/2018). Rex Krasarol Vincent, MD [facility]

## 2018-03-04 LAB — BASIC METABOLIC PANEL
BUN / CREAT RATIO: 10 (ref 9–23)
BUN: 10 mg/dL (ref 6–24)
CHLORIDE: 102 mmol/L (ref 96–106)
CO2: 22 mmol/L (ref 20–29)
Calcium: 8.5 mg/dL — ABNORMAL LOW (ref 8.7–10.2)
Creatinine, Ser: 0.99 mg/dL (ref 0.57–1.00)
GFR calc non Af Amer: 69 mL/min/{1.73_m2} (ref >59)
GFR, EST AFRICAN AMERICAN: 79 mL/min/{1.73_m2} (ref >59)
Glucose: 80 mg/dL (ref 65–99)
POTASSIUM: 5 mmol/L (ref 3.5–5.2)
Sodium: 137 mmol/L (ref 134–144)

## 2018-03-15 ENCOUNTER — Ambulatory Visit (INDEPENDENT_AMBULATORY_CARE_PROVIDER_SITE_OTHER): Payer: BLUE CROSS/BLUE SHIELD | Admitting: Physician Assistant

## 2018-03-15 ENCOUNTER — Encounter: Payer: Self-pay | Admitting: Physician Assistant

## 2018-03-15 VITALS — BP 136/90 | HR 89 | Temp 98.7°F | Ht 66.0 in | Wt 234.0 lb

## 2018-03-15 DIAGNOSIS — J301 Allergic rhinitis due to pollen: Secondary | ICD-10-CM | POA: Diagnosis not present

## 2018-03-15 DIAGNOSIS — Z0000 Encounter for general adult medical examination without abnormal findings: Secondary | ICD-10-CM | POA: Diagnosis not present

## 2018-03-15 DIAGNOSIS — I10 Essential (primary) hypertension: Secondary | ICD-10-CM | POA: Diagnosis not present

## 2018-03-15 MED ORDER — LOSARTAN POTASSIUM 50 MG PO TABS: 50.0000 mg | ORAL_TABLET | Freq: Every day | ORAL | 1 refills | Status: DC

## 2018-03-15 MED ORDER — LOSARTAN POTASSIUM 50 MG PO TABS: 50.0000 mg | ORAL_TABLET | Freq: Every day | ORAL | 0 refills | Status: DC

## 2018-03-15 MED ORDER — CETIRIZINE HCL 10 MG PO TABS: 10.0000 mg | ORAL_TABLET | Freq: Every day | ORAL | 11 refills | Status: DC

## 2018-03-15 NOTE — Progress Notes (Addendum)
BP 136/90   Pulse 89   Temp 98.7 F (37.1 C) (Oral)   Ht '5\' 6"'  (1.676 m)   Wt 234 lb (106.1 kg)   BMI 37.77 kg/m    Subjective:    Patient ID: Michelle Blake, female    DOB: 03/10/1971, 47 y.o.   MRN: [id number]  HPI: Michelle Blake is a 47 y.o. female presenting on 03/15/2018 for Hypertension (2 week rck)  This patient comes in for one-month recheck on her blood pressure.  She was started on losartan.  She reports that she is feeling good on it not having any difficulties.  Her blood pressure is greatly improved.  Labs today to check her renal function.  The patient does continue with a lot of postnasal drip and allergy with a cough that is mostly in the upper airway.  History reviewed. No pertinent past medical history. Relevant past medical, surgical, family and social history reviewed and updated as indicated. Interim medical history since our last visit reviewed. Allergies and medications reviewed and updated. DATA REVIEWED: CHART IN EPIC  Family History reviewed for pertinent findings.  Review of Systems  Allergies as of 03/15/2018   No Known Allergies     Medication List        Accurate as of 03/15/18 10:58 AM. Always use your most recent med list.          albuterol 108 (90 Base) MCG/ACT inhaler Commonly known as:  PROVENTIL HFA;VENTOLIN HFA Inhale 2 puffs into the lungs every 6 (six) hours as needed.   budesonide-formoterol 80-4.5 MCG/ACT inhaler Commonly known as:  SYMBICORT Inhale 2 puffs into the lungs 2 (two) times daily.   cetirizine 10 MG tablet Commonly known as:  ZYRTEC Take 1 tablet (10 mg total) by mouth daily.   losartan 50 MG tablet Commonly known as:  COZAAR Take 1 tablet (50 mg total) by mouth daily.          Objective:    BP 136/90   Pulse 89   Temp 98.7 F (37.1 C) (Oral)   Ht '5\' 6"'  (1.676 m)   Wt 234 lb (106.1 kg)   BMI 37.77 kg/m   No Known Allergies  Wt Readings from Last 3 Encounters:  03/15/18 234 lb (106.1  kg)  03/03/18 235 lb (106.6 kg)  11/20/16 232 lb (105.2 kg)    Physical Exam  Results for orders placed or performed in visit on 51/02/58  Basic Metabolic Panel  Result Value Ref Range   Glucose 80 65 - 99 mg/dL   BUN 10 6 - 24 mg/dL   Creatinine, Ser 0.99 0.57 - 1.00 mg/dL   GFR calc non Af Amer 69 >59 mL/min/1.73   GFR calc Af Amer 79 >59 mL/min/1.73   BUN/Creatinine Ratio 10 9 - 23   Sodium 137 134 - 144 mmol/L   Potassium 5.0 3.5 - 5.2 mmol/L   Chloride 102 96 - 106 mmol/L   CO2 22 20 - 29 mmol/L   Calcium 8.5 (L) 8.7 - 10.2 mg/dL      Assessment & Plan:   1. Essential hypertension - losartan (COZAAR) 50 MG tablet; Take 1 tablet (50 mg total) by mouth daily.  Dispense: 90 tablet; Refill: 1  2. Seasonal allergic rhinitis due to pollen - cetirizine (ZYRTEC) 10 MG tablet; Take 1 tablet (10 mg total) by mouth daily.  Dispense: 30 tablet; Refill: 11  3. Well adult exam Not performed, just for labs - CBC  with Differential/Platelet - CMP14+EGFR - Lipid panel - TSH    Continue all other maintenance medications as listed above.  Follow up plan: Return in about 3 months (around 06/15/2018) for recheck with MMM.  Educational handout given for [place] PA-C [facility] [address]  [place], [place] [address] [phone]   03/15/2018, 10:58 AM

## 2018-03-16 LAB — CBC WITH DIFFERENTIAL/PLATELET
BASOS: 0 %
Basophils Absolute: 0 10*3/uL (ref 0.0–0.2)
EOS (ABSOLUTE): 0.1 10*3/uL (ref 0.0–0.4)
EOS: 1 %
HEMATOCRIT: 41.5 % (ref 34.0–46.6)
Hemoglobin: 14.4 g/dL (ref 11.1–15.9)
IMMATURE GRANULOCYTES: 0 %
Immature Grans (Abs): 0 10*3/uL (ref 0.0–0.1)
LYMPHS ABS: 2.3 10*3/uL (ref 0.7–3.1)
Lymphs: 19 %
MCH: 32.2 pg (ref 26.6–33.0)
MCHC: 34.7 g/dL (ref 31.5–35.7)
MCV: 93 fL (ref 79–97)
MONOS ABS: 0.8 10*3/uL (ref 0.1–0.9)
Monocytes: 6 %
NEUTROS ABS: 9.2 10*3/uL — AB (ref 1.4–7.0)
Neutrophils: 74 %
Platelets: 292 10*3/uL (ref 150–450)
RBC: 4.47 x10E6/uL (ref 3.77–5.28)
RDW: 13.9 % (ref 12.3–15.4)
WBC: 12.4 10*3/uL — ABNORMAL HIGH (ref 3.4–10.8)

## 2018-03-16 LAB — LIPID PANEL
CHOL/HDL RATIO: 5.2 ratio — AB (ref 0.0–4.4)
Cholesterol, Total: 173 mg/dL (ref 100–199)
HDL: 33 mg/dL — ABNORMAL LOW (ref >39)
LDL Calculated: 117 mg/dL — ABNORMAL HIGH (ref 0–99)
TRIGLYCERIDES: 116 mg/dL (ref 0–149)
VLDL Cholesterol Cal: 23 mg/dL (ref 5–40)

## 2018-03-16 LAB — CMP14+EGFR
A/G RATIO: 1.7 (ref 1.2–2.2)
ALT: 11 IU/L (ref 0–32)
AST: 13 IU/L (ref 0–40)
Albumin: 3.9 g/dL (ref 3.5–5.5)
Alkaline Phosphatase: 82 IU/L (ref 39–117)
BILIRUBIN TOTAL: 0.3 mg/dL (ref 0.0–1.2)
BUN/Creatinine Ratio: 12 (ref 9–23)
BUN: 11 mg/dL (ref 6–24)
CALCIUM: 8.5 mg/dL — AB (ref 8.7–10.2)
CHLORIDE: 105 mmol/L (ref 96–106)
CO2: 22 mmol/L (ref 20–29)
Creatinine, Ser: 0.91 mg/dL (ref 0.57–1.00)
GFR calc Af Amer: 88 mL/min/{1.73_m2} (ref >59)
GFR, EST NON AFRICAN AMERICAN: 76 mL/min/{1.73_m2} (ref >59)
GLOBULIN, TOTAL: 2.3 g/dL (ref 1.5–4.5)
Glucose: 98 mg/dL (ref 65–99)
POTASSIUM: 4.5 mmol/L (ref 3.5–5.2)
SODIUM: 140 mmol/L (ref 134–144)
Total Protein: 6.2 g/dL (ref 6.0–8.5)

## 2018-03-16 LAB — TSH: TSH: 1.12 u[IU]/mL (ref 0.450–4.500)

## 2018-04-27 ENCOUNTER — Encounter: Payer: Self-pay | Admitting: Obstetrics and Gynecology

## 2018-04-27 ENCOUNTER — Other Ambulatory Visit: Payer: Self-pay

## 2018-04-27 ENCOUNTER — Ambulatory Visit (INDEPENDENT_AMBULATORY_CARE_PROVIDER_SITE_OTHER): Payer: BLUE CROSS/BLUE SHIELD | Admitting: Obstetrics and Gynecology

## 2018-04-27 VITALS — BP 168/100 | HR 98 | Resp 18 | Ht 66.0 in | Wt 234.0 lb

## 2018-04-27 DIAGNOSIS — N951 Menopausal and female climacteric states: Secondary | ICD-10-CM | POA: Diagnosis not present

## 2018-04-27 DIAGNOSIS — N8110 Cystocele, unspecified: Secondary | ICD-10-CM

## 2018-04-27 DIAGNOSIS — R102 Pelvic and perineal pain: Secondary | ICD-10-CM

## 2018-04-27 DIAGNOSIS — R350 Frequency of micturition: Secondary | ICD-10-CM

## 2018-04-27 DIAGNOSIS — N3946 Mixed incontinence: Secondary | ICD-10-CM

## 2018-04-27 DIAGNOSIS — N816 Rectocele: Secondary | ICD-10-CM

## 2018-04-27 LAB — POCT URINALYSIS DIPSTICK
Bilirubin, UA: NEGATIVE
GLUCOSE UA: NEGATIVE
Ketones, UA: NEGATIVE
LEUKOCYTES UA: NEGATIVE
NITRITE UA: NEGATIVE
PH UA: 5 (ref 5.0–8.0)
Protein, UA: NEGATIVE
RBC UA: NEGATIVE
Urobilinogen, UA: 0.2 E.U./dL

## 2018-04-27 NOTE — Progress Notes (Signed)
GYNECOLOGY  VISIT   HPI: 47 y.o.   Married  Caucasian  Female, G2.    No obstetric history on file. with No LMP recorded. (Menstrual status: IUD).   New patient here for complaint of urinary frequency and spontaneous leakage since IUD placement.  Urinary incontinence since her last Mirena was placed, March 2018.  Leakage has gotten worse.  Cannot go for 8 hours without being wet.  Leaks with cough, sneeze, bending over, exercise, laugh, sexual intercourse.  Can leak for no reason at all.  DF - every 2 hours. NF - 1 -2 during the night.  Has enuresis and can soak her pad and the bed.   Denies vaginal discharge, odor, or irritation.   No hx of urologic issues.  No UTIs or stones.  Went to [facility] for lower abdominal and back pain and told she may have a UTI.  tx for pain.   No issues with bowel function or control.  Can have sharp pain in the pelvis that goes down both legs.  Occurs daily and lasts for a few minutes to hours.  Increased pain with more activity.  Not associated with bleeding.   Has hot flashes and night sweats.  No bleeding at all.   Urine dip - negative.  GYNECOLOGIC HISTORY: No LMP recorded. (Menstrual status: IUD). Contraception:  IUD Mirena Menopausal hormone therapy:  n/a Last mammogram:  07/05/2013 BIRADS 0 Last pap smear:   11/20/1016  Normal        OB History    Gravida  2   Para  2   Term  2   Preterm  0   AB  0   Living  2     SAB  0   TAB  0   Ectopic  0   Multiple  0   Live Births  2              Patient Active Problem List   Diagnosis Date Noted  . Essential hypertension 01/30/2016  . Obesity (BMI 30-39.9) 01/30/2016  . Current smoker 01/30/2016    Past Medical History:  Diagnosis Date  . Anxiety   . Hypertension   . Urinary incontinence     Past Surgical History:  Procedure Laterality Date  . FOOT SURGERY    . WISDOM TOOTH EXTRACTION      Current Outpatient Medications  Medication Sig Dispense  Refill  . cetirizine (ZYRTEC) 10 MG tablet Take 1 tablet (10 mg total) by mouth daily. 30 tablet 11  . levonorgestrel (MIRENA) 20 MCG/24HR IUD 1 each by Intrauterine route once.    Marland Kitchen losartan (COZAAR) 50 MG tablet Take 1 tablet (50 mg total) by mouth daily. 90 tablet 1   No current facility-administered medications for this visit.      ALLERGIES: Patient has no known allergies.  Family History  Problem Relation Age of Onset  . Cancer Father        bladder  . Hypertension Father   . Parkinson's disease Father     Social History   Socioeconomic History  . Marital status: Married    Spouse name: Not on file  . Number of children: Not on file  . Years of education: Not on file  . Highest education level: Not on file  Occupational History  . Not on file  Social Needs  . Financial resource strain: Not on file  . Food insecurity:    Worry: Not on file    Inability:  Not on file  . Transportation needs:    Medical: Not on file    Non-medical: Not on file  Tobacco Use  . Smoking status: Current Every Day Smoker    Packs/day: 0.50  . Smokeless tobacco: Never Used  Substance and Sexual Activity  . Alcohol use: No  . Drug use: No  . Sexual activity: Yes    Birth control/protection: IUD  Lifestyle  . Physical activity:    Days per week: Not on file    Minutes per session: Not on file  . Stress: Not on file  Relationships  . Social connections:    Talks on phone: Not on file    Gets together: Not on file    Attends religious service: Not on file    Active member of club or organization: Not on file    Attends meetings of clubs or organizations: Not on file    Relationship status: Not on file  . Intimate partner violence:    Fear of current or ex partner: Not on file    Emotionally abused: Not on file    Physically abused: Not on file    Forced sexual activity: Not on file  Other Topics Concern  . Not on file  Social History Narrative  . Not on file    Review of  Systems  Constitutional: Negative.   HENT: Negative.   Eyes: Negative.   Respiratory: Negative.   Cardiovascular: Negative.   Gastrointestinal: Negative.   Endocrine: Negative.   Genitourinary: Positive for frequency and urgency.  Musculoskeletal: Negative.   Skin: Negative.   Allergic/Immunologic: Negative.   Neurological: Negative.   Hematological: Negative.   Psychiatric/Behavioral: Negative.   All other systems reviewed and are negative.   PHYSICAL EXAMINATION:    BP (!) 168/100   Pulse 98   Resp 18   Ht 5\' 6"  (1.676 m)   Wt 234 lb (106.1 kg)   BMI 37.77 kg/m     General appearance: alert, cooperative and appears stated age Head: Normocephalic, without obvious abnormality, atraumatic Neck: no adenopathy, supple, symmetrical, trachea midline and thyroid normal to inspection and palpation Lungs: clear to auscultation bilaterally  Heart: regular rate and rhythm Abdomen: soft, non-tender, no masses,  no organomegaly Extremities: extremities normal, atraumatic, no cyanosis or edema Skin: Skin color, texture, turgor normal. No rashes or lesions Lymph nodes: Cervical, supraclavicular, and axillary nodes normal. No abnormal inguinal nodes palpated Neurologic: Grossly normal  Pelvic: External genitalia:  no lesions              Urethra:  normal appearing urethra with no masses, tenderness or lesions              Bartholins and Skenes: normal                 Vagina: normal appearing vagina with normal color and discharge, no lesions.  First degree cystocele and rectocele.  Good uterine support.               Cervix: no lesions.  IUD strings noted.                 Bimanual Exam:  Uterus:  normal size, contour, position, consistency, mobility, non-tender              Adnexa: no mass, fullness, tenderness              Rectal exam: Yes.  .  Confirms.  Anus:  normal sphincter tone, no lesions  Chaperone was present for exam.  ASSESSMENT   Mixed incontinence.   Enuresis.  Cystocele and rectocele.  Menopausal symptoms.  Mirena IUD.  Pelvic pain. HTN.   PLAN  Urinary incontinence discussed and risk factors of childbearing, weight, chronic straining/lifting/coughing discussed.  Treatments reviewed - removal of bladder irritants, medical therapy, pessary, Impressa, PT, weight loss, and surgery.  Pelvic ultrasound recommended.  Menopause discussed and changes to reproductive function, bone health, and cardiovascular function reviewed.  FSH and E2.  TSH.  I discussed potential addition of estrogen to her Mirena IUD if HRT is entertained.  ACOG HO on incontinence and menopause brochure to patient.  Routine annual exam with her PCP.    An After Visit Summary was printed and given to the patient.  __45___ minutes face to face time of which over 50% was spent in counseling.

## 2018-04-28 ENCOUNTER — Telehealth: Payer: Self-pay | Admitting: Obstetrics and Gynecology

## 2018-04-28 LAB — ESTRADIOL: ESTRADIOL: 57.9 pg/mL

## 2018-04-28 LAB — FOLLICLE STIMULATING HORMONE: FSH: 11.7 m[IU]/mL

## 2018-04-28 LAB — TSH: TSH: 1.13 u[IU]/mL (ref 0.450–4.500)

## 2018-04-28 NOTE — Telephone Encounter (Signed)
Call placed to patient to review benefits and schedule recommended ultrasound. Unable to leave a voicemail message, as patients voice mailbox was full

## 2018-05-02 NOTE — Telephone Encounter (Signed)
Tried to call patient again to review benefits for recommended utlrasound. Unable to leave a voicemail message, as the patients voice mailbox continues to be full. I placed multiple calls in a row to patient so our office phone number will show on her missed calls multiple times.

## 2018-05-04 NOTE — Telephone Encounter (Signed)
Third call placed to patient to review benefits for recommended ultrasound. Unable to leave a voicemail message, as patients voice mailbox remains full.    Forwarding to Billie RuddySally Yeakley, RN for review

## 2018-05-04 NOTE — Telephone Encounter (Signed)
My Chart message sent

## 2018-05-11 NOTE — Telephone Encounter (Signed)
Last seen on 04-27-18. Pelvic ultrasound ordered. Multiple unsuccessful calls to patient. My Chart message sent which patient received and replied to. Still has not scheduled ultrasound.  Please advise.

## 2018-05-12 NOTE — Telephone Encounter (Signed)
Patient choice to do the ultrasound.  The indication is pain.  Order can be cancelled and re-ordered if needed.

## 2018-05-13 NOTE — Telephone Encounter (Signed)
Order cancelled.   Encounter closed.  

## 2018-06-10 ENCOUNTER — Ambulatory Visit: Payer: BLUE CROSS/BLUE SHIELD | Admitting: Nurse Practitioner

## 2018-06-28 ENCOUNTER — Encounter: Payer: Self-pay | Admitting: Nurse Practitioner

## 2018-06-28 ENCOUNTER — Ambulatory Visit (INDEPENDENT_AMBULATORY_CARE_PROVIDER_SITE_OTHER): Payer: BLUE CROSS/BLUE SHIELD | Admitting: Nurse Practitioner

## 2018-06-28 VITALS — BP 141/83 | HR 87 | Temp 97.4°F | Ht 66.0 in | Wt 234.0 lb

## 2018-06-28 DIAGNOSIS — I10 Essential (primary) hypertension: Secondary | ICD-10-CM

## 2018-06-28 DIAGNOSIS — B009 Herpesviral infection, unspecified: Secondary | ICD-10-CM | POA: Diagnosis not present

## 2018-06-28 DIAGNOSIS — E669 Obesity, unspecified: Secondary | ICD-10-CM | POA: Diagnosis not present

## 2018-06-28 DIAGNOSIS — F17200 Nicotine dependence, unspecified, uncomplicated: Secondary | ICD-10-CM | POA: Diagnosis not present

## 2018-06-28 MED ORDER — LOSARTAN POTASSIUM 50 MG PO TABS: 50.0000 mg | ORAL_TABLET | Freq: Every day | ORAL | 1 refills | Status: DC

## 2018-06-28 MED ORDER — VALACYCLOVIR HCL 1 G PO TABS: 1000.0000 mg | ORAL_TABLET | Freq: Two times a day (BID) | ORAL | 1 refills | Status: DC

## 2018-06-28 NOTE — Progress Notes (Signed)
Subjective:    Patient ID: Michelle Blake, female    DOB: 10-Jul-1971, 47 y.o.   MRN: [id number]   Chief Complaint: Medical Management of Chronic Issues   HPI:  1. Essential hypertension No c/o chest pain, ob or headache. Does not check blood pressure at home. BP Readings from Last 3 Encounters:  06/28/18 (!) 141/83  04/27/18 (!) 168/100  03/15/18 136/90      2. Obesity (BMI 30-39.9) \no recent weight changes  3. Current smoker  Still smoking over a pack a day    Outpatient Encounter Medications as of 06/28/2018  Medication Sig  . cetirizine (ZYRTEC) 10 MG tablet Take 1 tablet (10 mg total) by mouth daily.  Marland Kitchen levonorgestrel (MIRENA) 20 MCG/24HR IUD 1 each by Intrauterine route once.  Marland Kitchen losartan (COZAAR) 50 MG tablet Take 1 tablet (50 mg total) by mouth daily.       New complaints: Has been getting frequent fver blisters and would like to have some valtrex on hand.   Social history: Works at a [employer]: Negative for activity change and appetite change.  HENT: Negative.   Eyes: Negative for pain.  Respiratory: Negative for shortness of breath.   Cardiovascular: Negative for chest pain, palpitations and leg swelling.  Gastrointestinal: Negative for abdominal pain.  Endocrine: Negative for polydipsia.  Genitourinary: Negative.   Skin: Negative for rash.  Neurological: Negative for dizziness, weakness and headaches.  Hematological: Does not bruise/bleed easily.  Psychiatric/Behavioral: Negative.   All other systems reviewed and are negative.      Objective:   Physical Exam  Constitutional: She is oriented to person, place, and time. She appears well-developed and well-nourished. No distress.  HENT:  Head: Normocephalic.  Nose: Nose normal.  Mouth/Throat: Oropharynx is clear and moist.  Eyes: Pupils are equal, round, and reactive to light. EOM are normal.  Neck: Normal range of motion. Neck supple. No JVD present.  Carotid bruit is not present.  Cardiovascular: Normal rate, regular rhythm, normal heart sounds and intact distal pulses.  Pulmonary/Chest: Effort normal and breath sounds normal. No respiratory distress. She has no wheezes. She has no rales. She exhibits no tenderness.  Abdominal: Soft. Normal appearance, normal aorta and bowel sounds are normal. She exhibits no distension, no abdominal bruit, no pulsatile midline mass and no mass. There is no splenomegaly or hepatomegaly. There is no tenderness.  Musculoskeletal: Normal range of motion. She exhibits no edema.  Lymphadenopathy:    She has no cervical adenopathy.  Neurological: She is alert and oriented to person, place, and time. She has normal reflexes.  Skin: Skin is warm and dry.  Psychiatric: She has a normal mood and affect. Her behavior is normal. Judgment and thought content normal.  Nursing note and vitals reviewed.   BP (!) 141/83   Pulse 87   Temp (!) 97.4 F (36.3 C) (Oral)   Ht _0  (1.676 m)   Wt 234 lb (106.1 kg)   BMI 37.77 kg/m       Assessment & Plan:  Michelle Blake comes in today with chief complaint of Medical Management of Chronic Issues   Diagnosis and orders addressed:  1. Essential hypertension Low sodium diet - losartan (COZAAR) 50 MG tablet; Take 1 tablet (50 mg total) by mouth daily.  Dispense: 90 tablet; Refill: 1 - CMP14+EGFR - Lipid panel  2. Obesity (BMI 30-39.9) Discussed diet and exercise for person with BMI >25 Will recheck weight  in 3-6 months  3. Current smoker Smoking cessation encouraged  4. Herpes simplex type 1 infection - valACYclovir (VALTREX) 1000 MG tablet; Take 1 tablet (1,000 mg total) by mouth 2 (two) times daily.  Dispense: 12 tablet; Refill: 1   Labs pending Health Maintenance reviewed Diet and exercise encouraged  Follow up plan: 6 months   Mary-Margaret Hassell Done, FNP

## 2018-06-28 NOTE — Patient Instructions (Signed)
Cold Sore A cold sore, also called a fever blister, is a skin infection that is caused by a virus. This infection causes small, fluid-filled sores to form inside of the mouth or on the lips, gums, nose, chin, or cheeks. Cold sores can spread to other parts of the body, such as the eyes or fingers. Cold sores can be spread or passed from person to person (contagious) until the sores crust over completely. Cold sores can be spread through close contact, such as kissing or sharing a drinking glass. Follow these instructions at home: Medicines  Take or apply over-the-counter and prescription medicines only as told by your doctor.  Use a cotton-tip swab to apply creams or gels to your sores. Sore Care  Do not touch the sores or pick the scabs.  Wash your hands often. Do not touch your eyes without washing your hands first.  Keep the sores clean and dry.  If directed, apply ice to the sores:  Put ice in a plastic bag.  Place a towel between your skin and the bag.  Leave the ice on for 20 minutes, 2-3 times per day. Lifestyle  Do not kiss, have oral sex, or share personal items until your sores heal.  Eat a soft, bland diet. Avoid eating hot, cold, or salty foods. These can hurt your mouth.  Use a straw if it hurts to drink out of a glass.  Avoid the sun and limit your stress if these things trigger outbreaks. If sun causes cold sores, apply sunscreen on your lips before being out in the sun. Contact a doctor if:  You have symptoms for more than two weeks.  You have pus coming from the sores.  You have redness that is spreading.  You have pain or irritation in your eye.  You get sores on your genitals.  Your sores do not heal within two weeks.  You get cold sores often. Get help right away if:  You have a fever and your symptoms suddenly get worse.  You have a headache and confusion. This information is not intended to replace advice given to you by your health care  provider. Make sure you discuss any questions you have with your health care provider. Document Released: 03/08/2012 Document Revised: 02/13/2016 Document Reviewed: 06/28/2015 Elsevier Interactive Patient Education  2018 Elsevier Inc.  

## 2018-07-20 ENCOUNTER — Encounter: Payer: Self-pay | Admitting: Family Medicine

## 2018-07-20 ENCOUNTER — Ambulatory Visit (INDEPENDENT_AMBULATORY_CARE_PROVIDER_SITE_OTHER): Payer: BLUE CROSS/BLUE SHIELD | Admitting: Family Medicine

## 2018-07-20 VITALS — BP 136/92 | HR 102 | Temp 97.3°F | Ht 66.0 in | Wt 237.6 lb

## 2018-07-20 DIAGNOSIS — J020 Streptococcal pharyngitis: Secondary | ICD-10-CM | POA: Diagnosis not present

## 2018-07-20 LAB — RAPID STREP SCREEN (MED CTR MEBANE ONLY): STREP GP A AG, IA W/REFLEX: POSITIVE — AB

## 2018-07-20 MED ORDER — AMOXICILLIN 500 MG PO CAPS
500.0000 mg | ORAL_CAPSULE | Freq: Three times a day (TID) | ORAL | 0 refills | Status: DC
Start: 2018-07-20 — End: 2019-03-14

## 2018-07-20 NOTE — Progress Notes (Signed)
BP (!) 136/92   Pulse (!) 102   Temp (!) 97.3 F (36.3 C) (Oral)   Ht 5\' 6"  (1.676 m)   Wt 237 lb 9.6 oz (107.8 kg)   BMI 38.35 kg/m    Subjective:    Patient ID: Michelle Blake, female    DOB: 01-02-1971, 47 y.o.   MRN: [id number]  HPI: Michelle Blake is a 47 y.o. female presenting on 07/20/2018 for Sore Throat (Patient states she woke up this morning with white spots in the back of her throat- states that her throat has not really been sore)   HPI Sore throat and sinus congestion Patient comes in complaining sore throat and congestion and white spots in the back of her throat that she just noticed this morning.  She says her throat has not been that sore and she has not had any cough but does have some sinus congestion and drainage and then she noticed the spots in the back of her throat.  She denies any fevers or chills to this point.  She denies any sick contacts that she knows of.  She had used some ibuprofen but nothing else to this point.  Relevant past medical, surgical, family and social history reviewed and updated as indicated. Interim medical history since our last visit reviewed. Allergies and medications reviewed and updated.  Review of Systems  Constitutional: Negative for chills and fever.  HENT: Positive for congestion, postnasal drip, rhinorrhea, sinus pressure and sore throat. Negative for ear discharge, ear pain and sneezing.   Eyes: Negative for visual disturbance.  Respiratory: Negative for cough, chest tightness and shortness of breath.   Cardiovascular: Negative for chest pain and leg swelling.  Musculoskeletal: Negative for back pain and gait problem.  Skin: Negative for rash.  Neurological: Negative for light-headedness and headaches.  Psychiatric/Behavioral: Negative for agitation and behavioral problems.  All other systems reviewed and are negative.   Per HPI unless specifically indicated above   Allergies as of 07/20/2018   No Known  Allergies     Medication List        Accurate as of 07/20/18 10:38 AM. Always use your most recent med list.          amoxicillin 500 MG capsule Commonly known as:  AMOXIL Take 1 capsule (500 mg total) by mouth 3 (three) times daily.   cetirizine 10 MG tablet Commonly known as:  ZYRTEC Take 1 tablet (10 mg total) by mouth daily.   levonorgestrel 20 MCG/24HR IUD Commonly known as:  MIRENA 1 each by Intrauterine route once.   losartan 50 MG tablet Commonly known as:  COZAAR Take 1 tablet (50 mg total) by mouth daily.   valACYclovir 1000 MG tablet Commonly known as:  VALTREX Take 1 tablet (1,000 mg total) by mouth 2 (two) times daily.          Objective:    BP (!) 136/92   Pulse (!) 102   Temp (!) 97.3 F (36.3 C) (Oral)   Ht 5\' 6"  (1.676 m)   Wt 237 lb 9.6 oz (107.8 kg)   BMI 38.35 kg/m   Wt Readings from Last 3 Encounters:  07/20/18 237 lb 9.6 oz (107.8 kg)  06/28/18 234 lb (106.1 kg)  04/27/18 234 lb (106.1 kg)    Physical Exam  Constitutional: She is oriented to person, place, and time. She appears well-developed and well-nourished. No distress.  HENT:  Right Ear: External ear and ear canal normal.  Left Ear:  External ear and ear canal normal.  Nose: Mucosal edema and rhinorrhea present. No epistaxis. Right sinus exhibits no maxillary sinus tenderness and no frontal sinus tenderness. Left sinus exhibits no maxillary sinus tenderness and no frontal sinus tenderness.  Mouth/Throat: Uvula is midline and mucous membranes are normal. Oropharyngeal exudate, posterior oropharyngeal edema and posterior oropharyngeal erythema present. No tonsillar abscesses.  Eyes: Conjunctivae and EOM are normal.  Cardiovascular: Normal rate, regular rhythm, normal heart sounds and intact distal pulses.  No murmur heard. Pulmonary/Chest: Effort normal and breath sounds normal. No respiratory distress. She has no wheezes.  Musculoskeletal: Normal range of motion. She exhibits no  edema or tenderness.  Neurological: She is alert and oriented to person, place, and time. Coordination normal.  Skin: Skin is warm and dry. No rash noted. She is not diaphoretic.  Psychiatric: She has a normal mood and affect. Her behavior is normal.  Nursing note and vitals reviewed.   Rapid strep positive    Assessment & Plan:   Problem List Items Addressed This Visit    None    Visit Diagnoses    Strep pharyngitis    -  Primary   Relevant Medications   amoxicillin (AMOXIL) 500 MG capsule   Other Relevant Orders   Rapid Strep Screen ([facility] ONLY)       Follow up plan: Return if symptoms worsen or fail to improve.  Counseling provided for all of the vaccine components Orders Placed This Encounter  Procedures  . Rapid Strep Screen ([facility] ONLY)    Arville Care, MD [facility] 07/20/2018, 10:38 AM

## 2018-08-02 DIAGNOSIS — Z1231 Encounter for screening mammogram for malignant neoplasm of breast: Secondary | ICD-10-CM | POA: Diagnosis not present

## 2018-08-02 LAB — HM MAMMOGRAPHY

## 2018-10-29 ENCOUNTER — Other Ambulatory Visit: Payer: Self-pay | Admitting: Nurse Practitioner

## 2018-10-29 DIAGNOSIS — I10 Essential (primary) hypertension: Secondary | ICD-10-CM

## 2018-10-31 NOTE — Telephone Encounter (Signed)
Last seen 07/20/18

## 2018-12-29 ENCOUNTER — Encounter: Payer: BLUE CROSS/BLUE SHIELD | Admitting: Nurse Practitioner

## 2019-02-20 DIAGNOSIS — Z23 Encounter for immunization: Secondary | ICD-10-CM | POA: Diagnosis not present

## 2019-02-20 DIAGNOSIS — L03113 Cellulitis of right upper limb: Secondary | ICD-10-CM | POA: Diagnosis not present

## 2019-02-20 DIAGNOSIS — S61431A Puncture wound without foreign body of right hand, initial encounter: Secondary | ICD-10-CM | POA: Diagnosis not present

## 2019-02-20 DIAGNOSIS — Z6837 Body mass index (BMI) 37.0-37.9, adult: Secondary | ICD-10-CM | POA: Diagnosis not present

## 2019-02-27 ENCOUNTER — Other Ambulatory Visit: Payer: Self-pay

## 2019-02-28 ENCOUNTER — Encounter: Payer: BLUE CROSS/BLUE SHIELD | Admitting: Nurse Practitioner

## 2019-03-13 ENCOUNTER — Other Ambulatory Visit: Payer: Self-pay

## 2019-03-14 ENCOUNTER — Ambulatory Visit (INDEPENDENT_AMBULATORY_CARE_PROVIDER_SITE_OTHER): Payer: BC Managed Care – PPO | Admitting: Nurse Practitioner

## 2019-03-14 ENCOUNTER — Encounter: Payer: Self-pay | Admitting: Nurse Practitioner

## 2019-03-14 ENCOUNTER — Other Ambulatory Visit: Payer: Self-pay | Admitting: *Deleted

## 2019-03-14 VITALS — BP 145/81 | HR 88 | Temp 98.0°F | Ht 66.0 in | Wt 242.0 lb

## 2019-03-14 DIAGNOSIS — E669 Obesity, unspecified: Secondary | ICD-10-CM | POA: Diagnosis not present

## 2019-03-14 DIAGNOSIS — F17200 Nicotine dependence, unspecified, uncomplicated: Secondary | ICD-10-CM | POA: Diagnosis not present

## 2019-03-14 DIAGNOSIS — B009 Herpesviral infection, unspecified: Secondary | ICD-10-CM

## 2019-03-14 DIAGNOSIS — Z0001 Encounter for general adult medical examination with abnormal findings: Secondary | ICD-10-CM

## 2019-03-14 DIAGNOSIS — I10 Essential (primary) hypertension: Secondary | ICD-10-CM | POA: Diagnosis not present

## 2019-03-14 DIAGNOSIS — Z0000 Encounter for general adult medical examination without abnormal findings: Secondary | ICD-10-CM | POA: Diagnosis not present

## 2019-03-14 LAB — URINALYSIS, COMPLETE
Bilirubin, UA: NEGATIVE
Glucose, UA: NEGATIVE
Ketones, UA: NEGATIVE
Leukocytes,UA: NEGATIVE
Nitrite, UA: NEGATIVE
Protein,UA: NEGATIVE
Specific Gravity, UA: 1.025 (ref 1.005–1.030)
Urobilinogen, Ur: 0.2 mg/dL (ref 0.2–1.0)
pH, UA: 5.5 (ref 5.0–7.5)

## 2019-03-14 LAB — MICROSCOPIC EXAMINATION
Epithelial Cells (non renal): >10 /hpf — AB (ref 0–10)
Renal Epithel, UA: NONE SEEN /hpf

## 2019-03-14 LAB — LIPID PANEL

## 2019-03-14 MED ORDER — VALACYCLOVIR HCL 1 G PO TABS: 1000.0000 mg | ORAL_TABLET | Freq: Two times a day (BID) | ORAL | 1 refills | Status: DC

## 2019-03-14 MED ORDER — NEBIVOLOL HCL 10 MG PO TABS: 10.0000 mg | ORAL_TABLET | Freq: Every day | ORAL | 1 refills | Status: DC

## 2019-03-14 MED ORDER — LOSARTAN POTASSIUM 50 MG PO TABS: 50.0000 mg | ORAL_TABLET | Freq: Every day | ORAL | 1 refills | Status: DC

## 2019-03-14 NOTE — Addendum Note (Signed)
Addended by: Chevis Pretty on: 03/14/2019 09:13 AM   Modules accepted: Orders

## 2019-03-14 NOTE — Progress Notes (Addendum)
Subjective:    Patient ID: Michelle Blake, female    DOB: September 09, 1971, 48 y.o.   MRN: [id number]   Chief Complaint: Annual Exam    HPI:  1. Annual physical exam Last pap was done 11/20/16 and was normal.  2. Essential hypertension No c/o chest pain, sob or headache. She does not check blood pressure at home. BP Readings from Last 3 Encounters:  07/20/18 (!) 136/92  06/28/18 (!) 141/83  04/27/18 (!) 168/100     3. Obesity (BMI 30-39.9) No recent weight changes  4. Current smoker Still smokes over a pack a day. Has no desire to stop  5. Herpes simplex type 1 infection Last flare up was 4 months ago.     Outpatient Encounter Medications as of 03/14/2019  Medication Sig  . amoxicillin (AMOXIL) 500 MG capsule Take 1 capsule (500 mg total) by mouth 3 (three) times daily.  . cetirizine (ZYRTEC) 10 MG tablet Take 1 tablet (10 mg total) by mouth daily.  Marland Kitchen levonorgestrel (MIRENA) 20 MCG/24HR IUD 1 each by Intrauterine route once.  Marland Kitchen losartan (COZAAR) 50 MG tablet TAKE 1 TABLET BY MOUTH  DAILY  . valACYclovir (VALTREX) 1000 MG tablet Take 1 tablet (1,000 mg total) by mouth 2 (two) times daily.     Past Surgical History:  Procedure Laterality Date  . FOOT SURGERY    . WISDOM TOOTH EXTRACTION      Family History  Problem Relation Age of Onset  . Cancer Father        bladder  . Hypertension Father   . Parkinson's disease Father     New complaints: None today  Social history: Works at a bank     Review of Systems  Constitutional: Negative for activity change and appetite change.  HENT: Negative.   Eyes: Negative for pain.  Respiratory: Negative for shortness of breath.   Cardiovascular: Negative for chest pain, palpitations and leg swelling.  Gastrointestinal: Negative for abdominal pain.  Endocrine: Negative for polydipsia.  Genitourinary: Negative.   Skin: Negative for rash.  Neurological: Negative for dizziness, weakness and headaches.  Hematological:  Does not bruise/bleed easily.  Psychiatric/Behavioral: Negative.   All other systems reviewed and are negative.      Objective:   Physical Exam Vitals signs and nursing note reviewed.  Constitutional:      General: She is not in acute distress.    Appearance: Normal appearance. She is well-developed.  HENT:     Head: Normocephalic.     Nose: Nose normal.  Eyes:     Pupils: Pupils are equal, round, and reactive to light.  Neck:     Musculoskeletal: Normal range of motion and neck supple.     Vascular: No carotid bruit or JVD.  Cardiovascular:     Rate and Rhythm: Normal rate and regular rhythm.     Heart sounds: Normal heart sounds.  Pulmonary:     Effort: Pulmonary effort is normal. No respiratory distress.     Breath sounds: Normal breath sounds. No wheezing or rales.  Chest:     Chest wall: No tenderness.  Abdominal:     General: Bowel sounds are normal. There is no distension or abdominal bruit.     Palpations: Abdomen is soft. There is no hepatomegaly, splenomegaly, mass or pulsatile mass.     Tenderness: There is no abdominal tenderness.  Genitourinary:    General: Normal vulva.     Vagina: No vaginal discharge.     Rectum: Normal.  Comments: Cervix parous and pink No adnexal masses or tenderness Musculoskeletal: Normal range of motion.  Lymphadenopathy:     Cervical: No cervical adenopathy.  Skin:    General: Skin is warm and dry.  Neurological:     Mental Status: She is alert and oriented to person, place, and time.     Deep Tendon Reflexes: Reflexes are normal and symmetric.  Psychiatric:        Behavior: Behavior normal.        Thought Content: Thought content normal.        Judgment: Judgment normal.    BP (!) 145/81   Pulse 88   Temp 98 F (36.7 C) (Oral)   Ht '5\' 6"'  (1.676 m)   Wt 242 lb (109.8 kg)   BMI 39.06 kg/m         Assessment & Plan:  Shyana Kulakowski Son comes in today with chief complaint of Annual Exam (Thinks BP med is giving her  a cough)   Diagnosis and orders addressed:  1. Annual physical exam - Urinalysis, Complete - IGP, Aptima HPV, rfx 16/18,45 - Thyroid Panel With TSH - CBC with Differential/Platelet  2. Essential hypertension Low sodium diet - CMP14+EGFR - Lipid panel - stop  losartan (COZAAR)  Meds ordered this encounter  Medications  . DISCONTD: losartan (COZAAR) 50 MG tablet    Sig: Take 1 tablet (50 mg total) by mouth daily.    Dispense:  90 tablet    Refill:  1    Order Specific Question:   Supervising Provider    Answer:   Caryl Pina A [id number]  . nebivolol (BYSTOLIC) 10 MG tablet    Sig: Take 1 tablet (10 mg total) by mouth daily.    Dispense:  90 tablet    Refill:  1    Order Specific Question:   Supervising Provider    Answer:   Caryl Pina A [[id number]]     3. Obesity (BMI 30-39.9) Discussed diet and exercise for person with BMI >25 Will recheck weight in 3-6 months  4. Current smoker Smoking cessation encouraged  5. Herpes simplex type 1 infection   Labs pending Health Maintenance reviewed Diet and exercise encouraged  Follow up plan: 1 year   Elias-Fela Solis, FNP

## 2019-03-14 NOTE — Patient Instructions (Signed)

## 2019-03-15 LAB — CMP14+EGFR
ALT: 15 IU/L (ref 0–32)
AST: 15 IU/L (ref 0–40)
Albumin/Globulin Ratio: 1.6 (ref 1.2–2.2)
Albumin: 3.9 g/dL (ref 3.8–4.8)
Alkaline Phosphatase: 80 IU/L (ref 39–117)
BUN/Creatinine Ratio: 11 (ref 9–23)
BUN: 11 mg/dL (ref 6–24)
Bilirubin Total: 0.2 mg/dL (ref 0.0–1.2)
CO2: 20 mmol/L (ref 20–29)
Calcium: 8.7 mg/dL (ref 8.7–10.2)
Chloride: 105 mmol/L (ref 96–106)
Creatinine, Ser: 0.99 mg/dL (ref 0.57–1.00)
GFR calc Af Amer: 78 mL/min/{1.73_m2} (ref >59)
GFR calc non Af Amer: 68 mL/min/{1.73_m2} (ref >59)
Globulin, Total: 2.5 g/dL (ref 1.5–4.5)
Glucose: 103 mg/dL — ABNORMAL HIGH (ref 65–99)
Potassium: 4.3 mmol/L (ref 3.5–5.2)
Sodium: 140 mmol/L (ref 134–144)
Total Protein: 6.4 g/dL (ref 6.0–8.5)

## 2019-03-15 LAB — CBC WITH DIFFERENTIAL/PLATELET
Basophils Absolute: 0 10*3/uL (ref 0.0–0.2)
Basos: 0 %
EOS (ABSOLUTE): 0.1 10*3/uL (ref 0.0–0.4)
Eos: 2 %
Hematocrit: 42.6 % (ref 34.0–46.6)
Hemoglobin: 14.5 g/dL (ref 11.1–15.9)
Immature Grans (Abs): 0 10*3/uL (ref 0.0–0.1)
Immature Granulocytes: 0 %
Lymphocytes Absolute: 2.2 10*3/uL (ref 0.7–3.1)
Lymphs: 27 %
MCH: 32.3 pg (ref 26.6–33.0)
MCHC: 34 g/dL (ref 31.5–35.7)
MCV: 95 fL (ref 79–97)
Monocytes Absolute: 0.5 10*3/uL (ref 0.1–0.9)
Monocytes: 6 %
Neutrophils Absolute: 5.3 10*3/uL (ref 1.4–7.0)
Neutrophils: 65 %
Platelets: 292 10*3/uL (ref 150–450)
RBC: 4.49 x10E6/uL (ref 3.77–5.28)
RDW: 12.6 % (ref 11.7–15.4)
WBC: 8.1 10*3/uL (ref 3.4–10.8)

## 2019-03-15 LAB — THYROID PANEL WITH TSH
Free Thyroxine Index: 2.2 (ref 1.2–4.9)
T3 Uptake Ratio: 28 % (ref 24–39)
T4, Total: 7.8 ug/dL (ref 4.5–12.0)
TSH: 1.32 u[IU]/mL (ref 0.450–4.500)

## 2019-03-15 LAB — LIPID PANEL
Chol/HDL Ratio: 6 ratio — ABNORMAL HIGH (ref 0.0–4.4)
Cholesterol, Total: 193 mg/dL (ref 100–199)
HDL: 32 mg/dL — ABNORMAL LOW (ref >39)
LDL Calculated: 135 mg/dL — ABNORMAL HIGH (ref 0–99)
Triglycerides: 129 mg/dL (ref 0–149)
VLDL Cholesterol Cal: 26 mg/dL (ref 5–40)

## 2019-03-16 MED ORDER — ROSUVASTATIN CALCIUM 10 MG PO TABS: 10.0000 mg | ORAL_TABLET | Freq: Every day | ORAL | 1 refills | Status: DC

## 2019-03-16 NOTE — Addendum Note (Signed)
Addended by: Chevis Pretty on: 03/16/2019 03:49 PM   Modules accepted: Orders

## 2019-03-21 LAB — IGP, APTIMA HPV, RFX 16/18,45: HPV Aptima: NEGATIVE

## 2019-07-25 ENCOUNTER — Other Ambulatory Visit: Payer: Self-pay | Admitting: Nurse Practitioner

## 2019-07-28 ENCOUNTER — Other Ambulatory Visit: Payer: Self-pay | Admitting: Nurse Practitioner

## 2019-07-28 DIAGNOSIS — I10 Essential (primary) hypertension: Secondary | ICD-10-CM

## 2019-08-12 DIAGNOSIS — Z6837 Body mass index (BMI) 37.0-37.9, adult: Secondary | ICD-10-CM | POA: Diagnosis not present

## 2019-08-12 DIAGNOSIS — U071 COVID-19: Secondary | ICD-10-CM | POA: Diagnosis not present

## 2019-08-12 DIAGNOSIS — R0981 Nasal congestion: Secondary | ICD-10-CM | POA: Diagnosis not present

## 2019-08-12 DIAGNOSIS — J029 Acute pharyngitis, unspecified: Secondary | ICD-10-CM | POA: Diagnosis not present

## 2019-08-13 ENCOUNTER — Other Ambulatory Visit: Payer: Self-pay | Admitting: Nurse Practitioner

## 2019-08-13 DIAGNOSIS — I10 Essential (primary) hypertension: Secondary | ICD-10-CM

## 2019-10-16 ENCOUNTER — Ambulatory Visit (INDEPENDENT_AMBULATORY_CARE_PROVIDER_SITE_OTHER): Payer: BC Managed Care – PPO | Admitting: Physician Assistant

## 2019-10-16 ENCOUNTER — Other Ambulatory Visit: Payer: Self-pay

## 2019-10-16 ENCOUNTER — Encounter: Payer: Self-pay | Admitting: Physician Assistant

## 2019-10-16 DIAGNOSIS — H938X3 Other specified disorders of ear, bilateral: Secondary | ICD-10-CM | POA: Diagnosis not present

## 2019-10-16 DIAGNOSIS — R059 Cough, unspecified: Secondary | ICD-10-CM

## 2019-10-16 DIAGNOSIS — Z8616 Personal history of COVID-19: Secondary | ICD-10-CM | POA: Insufficient documentation

## 2019-10-16 DIAGNOSIS — R509 Fever, unspecified: Secondary | ICD-10-CM

## 2019-10-16 DIAGNOSIS — R05 Cough: Secondary | ICD-10-CM | POA: Diagnosis not present

## 2019-10-16 MED ORDER — PREDNISONE 10 MG (21) PO TBPK: ORAL_TABLET | ORAL | 0 refills | Status: DC

## 2019-10-16 MED ORDER — AZITHROMYCIN 250 MG PO TABS: ORAL_TABLET | ORAL | 0 refills | Status: DC

## 2019-10-16 NOTE — Progress Notes (Signed)
817      Telephone visit  Subjective: [name], fever, cough PCP: Michelle Pierini, FNP Michelle Blake is a 49 y.o. female calls for telephone consult today. Patient provides verbal consent for consult held via phone.  Patient is identified with 2 separate identifiers.  At this time the entire area is on COVID-19 social distancing and stay home orders are in place.  Patient is of higher risk and therefore we are performing this by a virtual method.  Location of patient: home Location of provider: [facility] Others present for call: no  Patient with several days of progressing upper respiratory and bronchial symptoms. Initially there was more upper respiratory congestion. This progressed to having significant cough that is productive throughout the day and severe at night.  She is known for getting bronchitis after an upper respiratory infection.  There has been some mild fever.  Nothing that she has taken over-the-counter has helped yet.. Denies any blood.    ROS: Per HPI  Allergies  Allergen Reactions  . Ace Inhibitors Cough  . Losartan Cough   Past Medical History:  Diagnosis Date  . Anxiety   . Hypertension   . Urinary incontinence     Current Outpatient Medications:  .  azithromycin (ZITHROMAX Z-PAK) 250 MG tablet, Take as directed, Disp: 6 each, Rfl: 0 .  cetirizine (ZYRTEC) 10 MG tablet, Take 1 tablet (10 mg total) by mouth daily., Disp: 30 tablet, Rfl: 11 .  levonorgestrel (MIRENA) 20 MCG/24HR IUD, 1 each by Intrauterine route once., Disp: , Rfl:  .  nebivolol (BYSTOLIC) 10 MG tablet, Take 1 tablet (10 mg total) by mouth daily., Disp: 90 tablet, Rfl: 1 .  predniSONE (STERAPRED UNI-PAK 21 TAB) 10 MG (21) TBPK tablet, As directed x 6 days, Disp: 21 tablet, Rfl: 0 .  rosuvastatin (CRESTOR) 10 MG tablet, TAKE 1 TABLET BY MOUTH  DAILY, Disp: 90 tablet, Rfl: 0 .  valACYclovir (VALTREX) 1000 MG tablet, Take 1 tablet (1,000 mg total) by mouth 2 (two) times daily.,  Disp: 30 tablet, Rfl: 1  Assessment/ Plan: 49 y.o. female   1. Cough - azithromycin (ZITHROMAX Z-PAK) 250 MG tablet; Take as directed  Dispense: 6 each; Refill: 0 - predniSONE (STERAPRED UNI-PAK 21 TAB) 10 MG (21) TBPK tablet; As directed x 6 days  Dispense: 21 tablet; Refill: 0  2. Congestion of both ears - azithromycin (ZITHROMAX Z-PAK) 250 MG tablet; Take as directed  Dispense: 6 each; Refill: 0  3. Fever, unspecified fever cause - azithromycin (ZITHROMAX Z-PAK) 250 MG tablet; Take as directed  Dispense: 6 each; Refill: 0  4. History of COVID-19    No follow-ups on file.  Continue all other maintenance medications as listed above.  Start time: 8:17 AM End time: 8:26 AM  Meds ordered this encounter  Medications  . azithromycin (ZITHROMAX Z-PAK) 250 MG tablet    Sig: Take as directed    Dispense:  6 each    Refill:  0    Order Specific Question:   Supervising Provider    Answer:   Raliegh Ip [[id number]]  . predniSONE (STERAPRED UNI-PAK 21 TAB) 10 MG (21) TBPK tablet    Sig: As directed x 6 days    Dispense:  21 tablet    Refill:  0    Order Specific Question:   Supervising Provider    Answer:   Raliegh Ip [[id number]]    Prudy Feeler PA-C [facility] [phone]

## 2019-12-12 ENCOUNTER — Other Ambulatory Visit: Payer: Self-pay | Admitting: Nurse Practitioner

## 2019-12-12 DIAGNOSIS — I10 Essential (primary) hypertension: Secondary | ICD-10-CM

## 2019-12-20 ENCOUNTER — Encounter: Payer: Self-pay | Admitting: *Deleted

## 2020-01-14 ENCOUNTER — Other Ambulatory Visit: Payer: Self-pay | Admitting: Nurse Practitioner

## 2020-01-15 NOTE — Telephone Encounter (Signed)
OV 03/14/19 rtc 1 yr

## 2020-03-14 ENCOUNTER — Other Ambulatory Visit: Payer: Self-pay

## 2020-03-14 ENCOUNTER — Encounter: Payer: Self-pay | Admitting: Nurse Practitioner

## 2020-03-14 ENCOUNTER — Ambulatory Visit (INDEPENDENT_AMBULATORY_CARE_PROVIDER_SITE_OTHER): Payer: BC Managed Care – PPO | Admitting: Nurse Practitioner

## 2020-03-14 ENCOUNTER — Other Ambulatory Visit (HOSPITAL_COMMUNITY)
Admission: RE | Admit: 2020-03-14 | Discharge: 2020-03-14 | Disposition: A | Discharge status: Home / Self Care | Payer: BC Managed Care – PPO | Source: Ambulatory Visit | Attending: Nurse Practitioner | Admitting: Nurse Practitioner

## 2020-03-14 VITALS — BP 151/87 | HR 91 | Temp 97.8°F | Resp 20 | Ht 66.0 in | Wt 245.0 lb

## 2020-03-14 DIAGNOSIS — Z0000 Encounter for general adult medical examination without abnormal findings: Secondary | ICD-10-CM | POA: Insufficient documentation

## 2020-03-14 DIAGNOSIS — F17200 Nicotine dependence, unspecified, uncomplicated: Secondary | ICD-10-CM | POA: Diagnosis not present

## 2020-03-14 DIAGNOSIS — Z1159 Encounter for screening for other viral diseases: Secondary | ICD-10-CM | POA: Diagnosis not present

## 2020-03-14 DIAGNOSIS — B009 Herpesviral infection, unspecified: Secondary | ICD-10-CM | POA: Diagnosis not present

## 2020-03-14 DIAGNOSIS — I10 Essential (primary) hypertension: Secondary | ICD-10-CM

## 2020-03-14 DIAGNOSIS — E669 Obesity, unspecified: Secondary | ICD-10-CM

## 2020-03-14 DIAGNOSIS — Z0001 Encounter for general adult medical examination with abnormal findings: Secondary | ICD-10-CM | POA: Diagnosis not present

## 2020-03-14 LAB — URINALYSIS, COMPLETE
Bilirubin, UA: NEGATIVE
Glucose, UA: NEGATIVE
Ketones, UA: NEGATIVE
Leukocytes,UA: NEGATIVE
Nitrite, UA: NEGATIVE
Protein,UA: NEGATIVE
Specific Gravity, UA: 1.02 (ref 1.005–1.030)
Urobilinogen, Ur: 0.2 mg/dL (ref 0.2–1.0)
pH, UA: 6 (ref 5.0–7.5)

## 2020-03-14 LAB — MICROSCOPIC EXAMINATION
Epithelial Cells (non renal): >10 /hpf — AB (ref 0–10)
Renal Epithel, UA: NONE SEEN /hpf

## 2020-03-14 MED ORDER — ROSUVASTATIN CALCIUM 10 MG PO TABS: 10.0000 mg | ORAL_TABLET | Freq: Every day | ORAL | 1 refills | Status: DC

## 2020-03-14 MED ORDER — LOSARTAN POTASSIUM 50 MG PO TABS: 50.0000 mg | ORAL_TABLET | Freq: Every day | ORAL | 1 refills | Status: DC

## 2020-03-14 NOTE — Progress Notes (Signed)
Subjective:    Patient ID: Michelle Blake, female    DOB: Feb 16, 1971, 49 y.o.   MRN: [id number]   Chief Complaint: Annual Exam    HPI:  1. Annual physical exam Patient is here today for CPE and PAP- her last pap was normal.  2. Essential hypertension No c/o chest pain, sob ir headache. Does not check blood pressure at home. BP Readings from Last 3 Encounters:  03/14/20 (!) 151/87  03/14/19 (!) 145/81  07/20/18 (!) 136/92     3. Current smoker Smokes over a pack a day. Has no desire to quit at this time.  4. Herpes simplex type 1 infection No recent flare ups. Valtrex works well when occurs  5. Obesity (BMI 30-39.9) No recent weight changes Wt Readings from Last 3 Encounters:  03/14/20 245 lb (111.1 kg)  03/14/19 242 lb (109.8 kg)  07/20/18 237 lb 9.6 oz (107.8 kg)   BMI Readings from Last 3 Encounters:  03/14/20 39.54 kg/m  03/14/19 39.06 kg/m  07/20/18 38.35 kg/m       Outpatient Encounter Medications as of 03/14/2020  Medication Sig   levonorgestrel (MIRENA) 20 MCG/24HR IUD 1 each by Intrauterine route once.   rosuvastatin (CRESTOR) 10 MG tablet TAKE 1 TABLET BY MOUTH  DAILY   valACYclovir (VALTREX) 1000 MG tablet Take 1 tablet (1,000 mg total) by mouth 2 (two) times daily.     Past Surgical History:  Procedure Laterality Date   FOOT SURGERY     WISDOM TOOTH EXTRACTION      Family History  Problem Relation Age of Onset   Cancer Father        bladder   Hypertension Father    Parkinson's disease Father     New complaints: None today  Social history: Lives with husband and daughter. Works at [employer] substance contract: n/a    Review of Systems  Constitutional: Negative for diaphoresis.  Eyes: Negative for pain.  Respiratory: Negative for shortness of breath.   Cardiovascular: Negative for chest pain, palpitations and leg swelling.  Gastrointestinal: Negative for abdominal pain.  Endocrine: Negative for polydipsia.    Skin: Negative for rash.  Neurological: Negative for dizziness, weakness and headaches.  Hematological: Does not bruise/bleed easily.  All other systems reviewed and are negative.      Objective:   Physical Exam Vitals and nursing note reviewed.  Constitutional:      General: She is not in acute distress.    Appearance: Normal appearance. She is well-developed.  HENT:     Head: Normocephalic.     Nose: Nose normal.  Eyes:     Pupils: Pupils are equal, round, and reactive to light.  Neck:     Vascular: No carotid bruit or JVD.  Cardiovascular:     Rate and Rhythm: Normal rate and regular rhythm.     Heart sounds: Normal heart sounds.  Pulmonary:     Effort: Pulmonary effort is normal. No respiratory distress.     Breath sounds: Normal breath sounds. No wheezing or rales.  Chest:     Chest wall: No tenderness.  Abdominal:     General: Bowel sounds are normal. There is no distension or abdominal bruit.     Palpations: Abdomen is soft. There is no hepatomegaly, splenomegaly, mass or pulsatile mass.     Tenderness: There is no abdominal tenderness.  Musculoskeletal:        General: Normal range of motion.     Cervical back: Normal  range of motion and neck supple.  Lymphadenopathy:     Cervical: No cervical adenopathy.  Skin:    General: Skin is warm and dry.  Neurological:     Mental Status: She is alert and oriented to person, place, and time.     Deep Tendon Reflexes: Reflexes are normal and symmetric.  Psychiatric:        Behavior: Behavior normal.        Thought Content: Thought content normal.        Judgment: Judgment normal.    BP (!) 151/87    Pulse 91    Temp 97.8 F (36.6 C) (Temporal)    Resp 20    Ht _0  (1.676 m)    Wt 245 lb (111.1 kg)    SpO2 91%    BMI 39.54 kg/m          Assessment & Plan:  Michelle Blake comes in today with chief complaint of Annual Exam   Diagnosis and orders addressed:  1. Annual physical exam - Urinalysis,  Complete - CBC with Differential/Platelet - Thyroid Panel With TSH - Cytology - PAP  2. Essential hypertension Low sodium diet - CMP14+EGFR - Lipid panel  3. Current smoker Smoking cesation encouraged  4. Herpes simplex type 1 infection Valtrex as needed  5. Obesity (BMI 30-39.9) Discussed diet and exercise for person with BMI >25 Will recheck weight in 3-6 months    Labs pending Health Maintenance reviewed Diet and exercise encouraged  Follow up plan: 6 months   Mary-Margaret Hassell Done, FNP

## 2020-03-14 NOTE — Patient Instructions (Signed)
DASH Eating Plan DASH stands for "Dietary Approaches to Stop Hypertension." The DASH eating plan is a healthy eating plan that has been shown to reduce high blood pressure (hypertension). It may also reduce your risk for type 2 diabetes, heart disease, and stroke. The DASH eating plan may also help with weight loss. What are tips for following this plan?  General guidelines  Avoid eating more than 2,300 mg (milligrams) of salt (sodium) a day. If you have hypertension, you may need to reduce your sodium intake to 1,500 mg a day.  Limit alcohol intake to no more than 1 drink a day for nonpregnant women and 2 drinks a day for men. One drink equals 12 oz of beer, 5 oz of wine, or 1 oz of hard liquor.  Work with your health care provider to maintain a healthy body weight or to lose weight. Ask what an ideal weight is for you.  Get at least 30 minutes of exercise that causes your heart to beat faster (aerobic exercise) most days of the week. Activities may include walking, swimming, or biking.  Work with your health care provider or diet and nutrition specialist (dietitian) to adjust your eating plan to your individual calorie needs. Reading food labels   Check food labels for the amount of sodium per serving. Choose foods with less than 5 percent of the Daily Value of sodium. Generally, foods with less than 300 mg of sodium per serving fit into this eating plan.  To find whole grains, look for the word "whole" as the first word in the ingredient list. Shopping  Buy products labeled as "low-sodium" or "no salt added."  Buy fresh foods. Avoid canned foods and premade or frozen meals. Cooking  Avoid adding salt when cooking. Use salt-free seasonings or herbs instead of table salt or sea salt. Check with your health care provider or pharmacist before using salt substitutes.  Do not fry foods. Cook foods using healthy methods such as baking, boiling, grilling, and broiling instead.  Cook with  heart-healthy oils, such as olive, canola, soybean, or sunflower oil. Meal planning  Eat a balanced diet that includes: ? 5 or more servings of fruits and vegetables each day. At each meal, try to fill half of your plate with fruits and vegetables. ? Up to 6-8 servings of whole grains each day. ? Less than 6 oz of lean meat, poultry, or fish each day. A 3-oz serving of meat is about the same size as a deck of cards. One egg equals 1 oz. ? 2 servings of low-fat dairy each day. ? A serving of nuts, seeds, or beans 5 times each week. ? Heart-healthy fats. Healthy fats called Omega-3 fatty acids are found in foods such as flaxseeds and coldwater fish, like sardines, salmon, and mackerel.  Limit how much you eat of the following: ? Canned or prepackaged foods. ? Food that is high in trans fat, such as fried foods. ? Food that is high in saturated fat, such as fatty meat. ? Sweets, desserts, sugary drinks, and other foods with added sugar. ? Full-fat dairy products.  Do not salt foods before eating.  Try to eat at least 2 vegetarian meals each week.  Eat more home-cooked food and less restaurant, buffet, and fast food.  When eating at a restaurant, ask that your food be prepared with less salt or no salt, if possible. What foods are recommended? The items listed may not be a complete list. Talk with your dietitian about   what dietary choices are best for you. Grains Whole-grain or whole-wheat bread. Whole-grain or whole-wheat pasta. Brown rice. Oatmeal. Quinoa. Bulgur. Whole-grain and low-sodium cereals. Pita bread. Low-fat, low-sodium crackers. Whole-wheat flour tortillas. Vegetables Fresh or frozen vegetables (raw, steamed, roasted, or grilled). Low-sodium or reduced-sodium tomato and vegetable juice. Low-sodium or reduced-sodium tomato sauce and tomato paste. Low-sodium or reduced-sodium canned vegetables. Fruits All fresh, dried, or frozen fruit. Canned fruit in natural juice (without  added sugar). Meat and other protein foods Skinless chicken or turkey. Ground chicken or turkey. Pork with fat trimmed off. Fish and seafood. Egg whites. Dried beans, peas, or lentils. Unsalted nuts, nut butters, and seeds. Unsalted canned beans. Lean cuts of beef with fat trimmed off. Low-sodium, lean deli meat. Dairy Low-fat (1%) or fat-free (skim) milk. Fat-free, low-fat, or reduced-fat cheeses. Nonfat, low-sodium ricotta or cottage cheese. Low-fat or nonfat yogurt. Low-fat, low-sodium cheese. Fats and oils Soft margarine without trans fats. Vegetable oil. Low-fat, reduced-fat, or light mayonnaise and salad dressings (reduced-sodium). Canola, safflower, olive, soybean, and sunflower oils. Avocado. Seasoning and other foods Herbs. Spices. Seasoning mixes without salt. Unsalted popcorn and pretzels. Fat-free sweets. What foods are not recommended? The items listed may not be a complete list. Talk with your dietitian about what dietary choices are best for you. Grains Baked goods made with fat, such as croissants, muffins, or some breads. Dry pasta or rice meal packs. Vegetables Creamed or fried vegetables. Vegetables in a cheese sauce. Regular canned vegetables (not low-sodium or reduced-sodium). Regular canned tomato sauce and paste (not low-sodium or reduced-sodium). Regular tomato and vegetable juice (not low-sodium or reduced-sodium). Pickles. Olives. Fruits Canned fruit in a light or heavy syrup. Fried fruit. Fruit in cream or butter sauce. Meat and other protein foods Fatty cuts of meat. Ribs. Fried meat. Bacon. Sausage. Bologna and other processed lunch meats. Salami. Fatback. Hotdogs. Bratwurst. Salted nuts and seeds. Canned beans with added salt. Canned or smoked fish. Whole eggs or egg yolks. Chicken or turkey with skin. Dairy Whole or 2% milk, cream, and half-and-half. Whole or full-fat cream cheese. Whole-fat or sweetened yogurt. Full-fat cheese. Nondairy creamers. Whipped toppings.  Processed cheese and cheese spreads. Fats and oils Butter. Stick margarine. Lard. Shortening. Ghee. Bacon fat. Tropical oils, such as coconut, palm kernel, or palm oil. Seasoning and other foods Salted popcorn and pretzels. Onion salt, garlic salt, seasoned salt, table salt, and sea salt. Worcestershire sauce. Tartar sauce. Barbecue sauce. Teriyaki sauce. Soy sauce, including reduced-sodium. Steak sauce. Canned and packaged gravies. Fish sauce. Oyster sauce. Cocktail sauce. Horseradish that you find on the shelf. Ketchup. Mustard. Meat flavorings and tenderizers. Bouillon cubes. Hot sauce and Tabasco sauce. Premade or packaged marinades. Premade or packaged taco seasonings. Relishes. Regular salad dressings. Where to find more information:  National Heart, Lung, and Blood Institute: www.nhlbi.nih.gov  American Heart Association: www.heart.org Summary  The DASH eating plan is a healthy eating plan that has been shown to reduce high blood pressure (hypertension). It may also reduce your risk for type 2 diabetes, heart disease, and stroke.  With the DASH eating plan, you should limit salt (sodium) intake to 2,300 mg a day. If you have hypertension, you may need to reduce your sodium intake to 1,500 mg a day.  When on the DASH eating plan, aim to eat more fresh fruits and vegetables, whole grains, lean proteins, low-fat dairy, and heart-healthy fats.  Work with your health care provider or diet and nutrition specialist (dietitian) to adjust your eating plan to your   individual calorie needs. This information is not intended to replace advice given to you by your health care provider. Make sure you discuss any questions you have with your health care provider. Document Revised: 08/20/2017 Document Reviewed: 08/31/2016 Elsevier Patient Education  2020 Elsevier Inc.  

## 2020-03-15 ENCOUNTER — Encounter: Payer: BC Managed Care – PPO | Admitting: Nurse Practitioner

## 2020-03-15 LAB — THYROID PANEL WITH TSH
Free Thyroxine Index: 1.8 (ref 1.2–4.9)
T3 Uptake Ratio: 26 % (ref 24–39)
T4, Total: 6.8 ug/dL (ref 4.5–12.0)
TSH: 1.04 u[IU]/mL (ref 0.450–4.500)

## 2020-03-15 LAB — LIPID PANEL
Chol/HDL Ratio: 5.4 ratio — ABNORMAL HIGH (ref 0.0–4.4)
Cholesterol, Total: 189 mg/dL (ref 100–199)
HDL: 35 mg/dL — ABNORMAL LOW (ref >39)
LDL Chol Calc (NIH): 130 mg/dL — ABNORMAL HIGH (ref 0–99)
Triglycerides: 130 mg/dL (ref 0–149)
VLDL Cholesterol Cal: 24 mg/dL (ref 5–40)

## 2020-03-15 LAB — CBC WITH DIFFERENTIAL/PLATELET
Basophils Absolute: 0 10*3/uL (ref 0.0–0.2)
Basos: 0 %
EOS (ABSOLUTE): 0.1 10*3/uL (ref 0.0–0.4)
Eos: 1 %
Hematocrit: 43.4 % (ref 34.0–46.6)
Hemoglobin: 15 g/dL (ref 11.1–15.9)
Immature Grans (Abs): 0 10*3/uL (ref 0.0–0.1)
Immature Granulocytes: 0 %
Lymphocytes Absolute: 2.3 10*3/uL (ref 0.7–3.1)
Lymphs: 23 %
MCH: 32.7 pg (ref 26.6–33.0)
MCHC: 34.6 g/dL (ref 31.5–35.7)
MCV: 95 fL (ref 79–97)
Monocytes Absolute: 0.6 10*3/uL (ref 0.1–0.9)
Monocytes: 5 %
Neutrophils Absolute: 7.1 10*3/uL — ABNORMAL HIGH (ref 1.4–7.0)
Neutrophils: 71 %
Platelets: 281 10*3/uL (ref 150–450)
RBC: 4.59 x10E6/uL (ref 3.77–5.28)
RDW: 12.5 % (ref 11.7–15.4)
WBC: 10.2 10*3/uL (ref 3.4–10.8)

## 2020-03-15 LAB — CMP14+EGFR
ALT: 11 IU/L (ref 0–32)
AST: 18 IU/L (ref 0–40)
Albumin/Globulin Ratio: 1.6 (ref 1.2–2.2)
Albumin: 4 g/dL (ref 3.8–4.8)
Alkaline Phosphatase: 89 IU/L (ref 48–121)
BUN/Creatinine Ratio: 9 (ref 9–23)
BUN: 9 mg/dL (ref 6–24)
Bilirubin Total: <0.2 mg/dL (ref 0.0–1.2)
CO2: 23 mmol/L (ref 20–29)
Calcium: 8.6 mg/dL — ABNORMAL LOW (ref 8.7–10.2)
Chloride: 103 mmol/L (ref 96–106)
Creatinine, Ser: 1.01 mg/dL — ABNORMAL HIGH (ref 0.57–1.00)
GFR calc Af Amer: 76 mL/min/{1.73_m2} (ref >59)
GFR calc non Af Amer: 66 mL/min/{1.73_m2} (ref >59)
Globulin, Total: 2.5 g/dL (ref 1.5–4.5)
Glucose: 100 mg/dL — ABNORMAL HIGH (ref 65–99)
Potassium: 4.5 mmol/L (ref 3.5–5.2)
Sodium: 138 mmol/L (ref 134–144)
Total Protein: 6.5 g/dL (ref 6.0–8.5)

## 2020-03-15 LAB — HEPATITIS C ANTIBODY: Hep C Virus Ab: <0.1 s/co ratio (ref 0.0–0.9)

## 2020-03-18 LAB — CYTOLOGY - PAP
Diagnosis: NEGATIVE
Diagnosis: REACTIVE

## 2020-04-05 ENCOUNTER — Other Ambulatory Visit: Payer: Self-pay | Admitting: Nurse Practitioner

## 2020-04-05 DIAGNOSIS — Z1231 Encounter for screening mammogram for malignant neoplasm of breast: Secondary | ICD-10-CM

## 2020-04-10 DIAGNOSIS — J449 Chronic obstructive pulmonary disease, unspecified: Secondary | ICD-10-CM | POA: Diagnosis not present

## 2020-04-10 DIAGNOSIS — R079 Chest pain, unspecified: Secondary | ICD-10-CM | POA: Diagnosis not present

## 2020-04-10 DIAGNOSIS — R072 Precordial pain: Secondary | ICD-10-CM | POA: Diagnosis not present

## 2020-04-10 DIAGNOSIS — Z79899 Other long term (current) drug therapy: Secondary | ICD-10-CM | POA: Diagnosis not present

## 2020-04-10 DIAGNOSIS — I739 Peripheral vascular disease, unspecified: Secondary | ICD-10-CM | POA: Diagnosis not present

## 2020-04-10 DIAGNOSIS — R9431 Abnormal electrocardiogram [ECG] [EKG]: Secondary | ICD-10-CM | POA: Diagnosis not present

## 2020-04-10 DIAGNOSIS — Z720 Tobacco use: Secondary | ICD-10-CM | POA: Diagnosis not present

## 2020-04-10 DIAGNOSIS — R0902 Hypoxemia: Secondary | ICD-10-CM | POA: Diagnosis not present

## 2020-04-10 DIAGNOSIS — I700 Atherosclerosis of aorta: Secondary | ICD-10-CM | POA: Diagnosis not present

## 2020-04-10 DIAGNOSIS — E785 Hyperlipidemia, unspecified: Secondary | ICD-10-CM | POA: Diagnosis not present

## 2020-04-10 DIAGNOSIS — Z20822 Contact with and (suspected) exposure to covid-19: Secondary | ICD-10-CM | POA: Diagnosis not present

## 2020-04-10 DIAGNOSIS — J439 Emphysema, unspecified: Secondary | ICD-10-CM | POA: Diagnosis not present

## 2020-04-10 DIAGNOSIS — I10 Essential (primary) hypertension: Secondary | ICD-10-CM | POA: Diagnosis not present

## 2020-04-10 DIAGNOSIS — F17210 Nicotine dependence, cigarettes, uncomplicated: Secondary | ICD-10-CM | POA: Diagnosis not present

## 2020-04-10 DIAGNOSIS — I7090 Unspecified atherosclerosis: Secondary | ICD-10-CM | POA: Diagnosis not present

## 2020-04-10 DIAGNOSIS — I708 Atherosclerosis of other arteries: Secondary | ICD-10-CM | POA: Diagnosis not present

## 2020-04-10 DIAGNOSIS — I771 Stricture of artery: Secondary | ICD-10-CM | POA: Diagnosis not present

## 2020-04-10 DIAGNOSIS — M549 Dorsalgia, unspecified: Secondary | ICD-10-CM | POA: Diagnosis not present

## 2020-04-11 DIAGNOSIS — R0902 Hypoxemia: Secondary | ICD-10-CM | POA: Diagnosis not present

## 2020-04-11 DIAGNOSIS — I501 Left ventricular failure: Secondary | ICD-10-CM | POA: Diagnosis not present

## 2020-04-11 DIAGNOSIS — R079 Chest pain, unspecified: Secondary | ICD-10-CM | POA: Diagnosis not present

## 2020-04-22 ENCOUNTER — Other Ambulatory Visit: Payer: Self-pay

## 2020-04-22 ENCOUNTER — Encounter: Payer: Self-pay | Admitting: Nurse Practitioner

## 2020-04-22 ENCOUNTER — Ambulatory Visit (INDEPENDENT_AMBULATORY_CARE_PROVIDER_SITE_OTHER): Payer: BC Managed Care – PPO | Admitting: Nurse Practitioner

## 2020-04-22 VITALS — BP 138/86 | HR 83 | Temp 97.6°F | Resp 20 | Ht 66.0 in | Wt 241.0 lb

## 2020-04-22 DIAGNOSIS — R1013 Epigastric pain: Secondary | ICD-10-CM | POA: Diagnosis not present

## 2020-04-22 MED ORDER — LANSOPRAZOLE 30 MG PO CPDR: 30.0000 mg | DELAYED_RELEASE_CAPSULE | Freq: Every day | ORAL | 3 refills | Status: DC

## 2020-04-22 NOTE — Progress Notes (Signed)
   Subjective:    Patient ID: Michelle Blake, female    DOB: 05-26-1971, 49 y.o.   MRN: [id number]   Chief Complaint: Hospitalization Follow-up (Needs cardiology referral  and wants gallbladder US)   HPI Patient went to the hospital on 04/10/20 with chest pain. They ran usual tests and EKG and said it was not her heart. Sh ewas sent home and was told to follow up with cardiology. Since going to the ER she still has the same pain. It is a sharp pain in her mid chest and radiates to her back. She denies a heavy feeling. Eating increases pain. She would like to have her gall bladder checked out.   Review of Systems  Constitutional: Negative for diaphoresis.  Eyes: Negative for pain.  Respiratory: Negative for shortness of breath.   Cardiovascular: Positive for chest pain. Negative for palpitations and leg swelling.  Gastrointestinal: Negative for abdominal pain, constipation, diarrhea, nausea and vomiting.  Endocrine: Negative for polydipsia.  Skin: Negative for rash.  Neurological: Negative for dizziness, weakness and headaches.  Hematological: Does not bruise/bleed easily.  All other systems reviewed and are negative.      Objective:   Physical Exam Vitals and nursing note reviewed.  Constitutional:      Appearance: Normal appearance.  Cardiovascular:     Rate and Rhythm: Normal rate and regular rhythm.     Heart sounds: Normal heart sounds.  Pulmonary:     Breath sounds: Normal breath sounds.  Abdominal:     General: Bowel sounds are normal. There is no distension.     Palpations: Abdomen is soft.     Tenderness: There is abdominal tenderness (mid epigastric pain on palpation).  Skin:    General: Skin is warm.  Neurological:     General: No focal deficit present.     Mental Status: She is alert and oriented to person, place, and time.  Psychiatric:        Behavior: Behavior normal.        Thought Content: Thought content normal.     BP (!) 138/86 (BP Location: Left  Arm, Cuff Size: Normal)   Pulse 83   Temp 97.6 F (36.4 C) (Temporal)   Resp 20   Ht 5\' 6"  (1.676 m)   Wt (!) 241 lb (109.3 kg)   SpO2 95%   BMI 38.90 kg/m           Assessment & Plan:  Michelle Blake in today with chief complaint of Hospitalization Follow-up (Needs cardiology referral  and wants gallbladder Sheral Apley)   1. Epigastric pain Order G/B [place] Avoid spicy and fatty foods will do cardiology referral once [place] is back  - lansoprazole (PREVACID) 30 MG capsule; Take 1 capsule (30 mg total) by mouth daily at 12 noon.  Dispense: 30 capsule; Refill: 3 - US Abdomen Limited RUQ; Future    The above assessment and management plan was discussed with the patient. The patient verbalized understanding of and has agreed to the management plan. Patient is aware to call the clinic if symptoms persist or worsen. Patient is aware when to return to the clinic for a follow-up visit. Patient educated on when it is appropriate to go to the emergency department.   Mary-Margaret [place], FNP

## 2020-04-22 NOTE — Patient Instructions (Signed)
Gastritis, Adult  Gastritis is swelling (inflammation) of the stomach. Gastritis can develop quickly (acute). It can also develop slowly over time (chronic). It is important to get help for this condition. If you do not get help, your stomach can bleed, and you can get sores (ulcers) in your stomach. What are the causes? This condition may be caused by:  Germs that get to your stomach.  Drinking too much alcohol.  Medicines you are taking.  Too much acid in the stomach.  A disease of the intestines or stomach.  Stress.  An allergic reaction.  Crohn's disease.  Some cancer treatments (radiation). Sometimes the cause of this condition is not known. What are the signs or symptoms? Symptoms of this condition include:  Pain in your stomach.  A burning feeling in your stomach.  Feeling sick to your stomach (nauseous).  Throwing up (vomiting).  Feeling too full after you eat.  Weight loss.  Bad breath.  Throwing up blood.  Blood in your poop (stool). How is this diagnosed? This condition may be diagnosed with:  Your medical history and symptoms.  A physical exam.  Tests. These can include: ? Blood tests. ? Stool tests. ? A procedure to look inside your stomach (upper endoscopy). ? A test in which a sample of tissue is taken for testing (biopsy). How is this treated? Treatment for this condition depends on what caused it. You may be given:  Antibiotic medicine, if your condition was caused by germs.  H2 blockers and similar medicines, if your condition was caused by too much acid. Follow these instructions at home: Medicines  Take over-the-counter and prescription medicines only as told by your doctor.  If you were prescribed an antibiotic medicine, take it as told by your doctor. Do not stop taking it even if you start to feel better. Eating and drinking   Eat small meals often, instead of large meals.  Avoid foods and drinks that make your symptoms  worse.  Drink enough fluid to keep your pee (urine) pale yellow. Alcohol use  Do not drink alcohol if: ? Your doctor tells you not to drink. ? You are pregnant, may be pregnant, or are planning to become pregnant.  If you drink alcohol: ? Limit your use to:  0-1 drink a day for women.  0-2 drinks a day for men. ? Be aware of how much alcohol is in your drink. In the [place] one drink equals one 12 oz bottle of beer (355 mL), one 5 oz glass of wine (148 mL), or one 1 oz glass of hard liquor (44 mL). General instructions  Talk with your doctor about ways to manage stress. You can exercise or do deep breathing, meditation, or yoga.  Do not smoke or use products that have nicotine or tobacco. If you need help quitting, ask your doctor.  Keep all follow-up visits as told by your doctor. This is important. Contact a doctor if:  Your symptoms get worse.  Your symptoms go away and then come back. Get help right away if:  You throw up blood or something that looks like coffee grounds.  You have black or dark red poop.  You throw up any time you try to drink fluids.  Your stomach pain gets worse.  You have a fever.  You do not feel better after one week. Summary  Gastritis is swelling (inflammation) of the stomach.  You must get help for this condition. If you do not get help, your stomach   can bleed, and you can get sores (ulcers).  This condition is diagnosed with medical history, physical exam, or tests.  You can be treated with medicines for germs or medicines to block too much acid in your stomach. This information is not intended to replace advice given to you by your health care provider. Make sure you discuss any questions you have with your health care provider. Document Revised: 01/25/2018 Document Reviewed: 01/25/2018 Elsevier Patient Education  2020 Elsevier Inc.  

## 2020-04-23 ENCOUNTER — Telehealth: Payer: Self-pay | Admitting: Nurse Practitioner

## 2020-04-30 ENCOUNTER — Ambulatory Visit (HOSPITAL_COMMUNITY)
Admission: RE | Admit: 2020-04-30 | Discharge: 2020-04-30 | Disposition: A | Discharge status: Home / Self Care | Payer: BC Managed Care – PPO | Source: Ambulatory Visit | Attending: Nurse Practitioner | Admitting: Nurse Practitioner

## 2020-04-30 ENCOUNTER — Other Ambulatory Visit: Payer: Self-pay

## 2020-04-30 DIAGNOSIS — R1013 Epigastric pain: Secondary | ICD-10-CM | POA: Insufficient documentation

## 2020-04-30 DIAGNOSIS — K760 Fatty (change of) liver, not elsewhere classified: Secondary | ICD-10-CM | POA: Diagnosis not present

## 2020-07-19 ENCOUNTER — Ambulatory Visit
Admission: RE | Admit: 2020-07-19 | Discharge: 2020-07-19 | Disposition: A | Discharge status: Home / Self Care | Payer: BC Managed Care – PPO | Source: Ambulatory Visit | Attending: Nurse Practitioner | Admitting: Nurse Practitioner

## 2020-07-19 ENCOUNTER — Other Ambulatory Visit: Payer: Self-pay

## 2020-07-19 DIAGNOSIS — Z1231 Encounter for screening mammogram for malignant neoplasm of breast: Secondary | ICD-10-CM

## 2020-07-24 ENCOUNTER — Other Ambulatory Visit: Payer: Self-pay | Admitting: Nurse Practitioner

## 2020-08-09 ENCOUNTER — Other Ambulatory Visit: Payer: Self-pay | Admitting: Nurse Practitioner

## 2020-09-17 ENCOUNTER — Ambulatory Visit (INDEPENDENT_AMBULATORY_CARE_PROVIDER_SITE_OTHER): Payer: BC Managed Care – PPO | Admitting: Nurse Practitioner

## 2020-09-17 ENCOUNTER — Encounter: Payer: Self-pay | Admitting: Nurse Practitioner

## 2020-09-17 ENCOUNTER — Other Ambulatory Visit: Payer: Self-pay

## 2020-09-17 VITALS — BP 110/82 | HR 86 | Temp 97.3°F | Resp 20 | Ht 66.0 in | Wt 233.0 lb

## 2020-09-17 DIAGNOSIS — R109 Unspecified abdominal pain: Secondary | ICD-10-CM | POA: Diagnosis not present

## 2020-09-17 DIAGNOSIS — F17200 Nicotine dependence, unspecified, uncomplicated: Secondary | ICD-10-CM

## 2020-09-17 DIAGNOSIS — I10 Essential (primary) hypertension: Secondary | ICD-10-CM | POA: Diagnosis not present

## 2020-09-17 DIAGNOSIS — B009 Herpesviral infection, unspecified: Secondary | ICD-10-CM

## 2020-09-17 DIAGNOSIS — K219 Gastro-esophageal reflux disease without esophagitis: Secondary | ICD-10-CM | POA: Insufficient documentation

## 2020-09-17 DIAGNOSIS — E669 Obesity, unspecified: Secondary | ICD-10-CM | POA: Diagnosis not present

## 2020-09-17 LAB — URINALYSIS, COMPLETE
Bilirubin, UA: NEGATIVE
Glucose, UA: NEGATIVE
Ketones, UA: NEGATIVE
Leukocytes,UA: NEGATIVE
Nitrite, UA: NEGATIVE
Protein,UA: NEGATIVE
Specific Gravity, UA: 1.025 (ref 1.005–1.030)
Urobilinogen, Ur: 0.2 mg/dL (ref 0.2–1.0)
pH, UA: 5.5 (ref 5.0–7.5)

## 2020-09-17 LAB — MICROSCOPIC EXAMINATION: WBC, UA: NONE SEEN /hpf (ref 0–5)

## 2020-09-17 MED ORDER — LOSARTAN POTASSIUM 50 MG PO TABS: 50.0000 mg | ORAL_TABLET | Freq: Every day | ORAL | 1 refills | Status: DC

## 2020-09-17 MED ORDER — LANSOPRAZOLE 30 MG PO CPDR
30.0000 mg | DELAYED_RELEASE_CAPSULE | Freq: Every day | ORAL | 3 refills | Status: DC
Start: 2020-09-17 — End: 2021-02-18

## 2020-09-17 MED ORDER — ROSUVASTATIN CALCIUM 10 MG PO TABS: 10.0000 mg | ORAL_TABLET | Freq: Every day | ORAL | 1 refills | Status: DC

## 2020-09-17 NOTE — Progress Notes (Addendum)
Subjective:    Patient ID: Michelle Blake, female    DOB: May 26, 1971, 49 y.o.   MRN: [id number]   Chief Complaint: Medical Management of Chronic Issues    HPI:  1. Essential hypertension No c/o chest pain, sob or headache. Doe snot check blod pressure at home. BP Readings from Last 3 Encounters:  04/22/20 (!) 138/86  03/14/20 (!) 151/87  03/14/19 (!) 145/81     2. Current smoker Smokes over a pack a day. Has no desire to quit right now.  3. Herpes simplex type 1 infection Has had no recent flare up. Uses valtrex when needed.  4. Obesity (BMI 30-39.9) Weight is down 8lbs Wt Readings from Last 3 Encounters:  09/17/20 233 lb (105.7 kg)  04/22/20 (!) 241 lb (109.3 kg)  03/14/20 245 lb (111.1 kg)   BMI Readings from Last 3 Encounters:  09/17/20 37.61 kg/m  04/22/20 38.90 kg/m  03/14/20 39.54 kg/m       Outpatient Encounter Medications as of 09/17/2020  Medication Sig  . famotidine (PEPCID) 20 MG tablet Take 20 mg by mouth 2 (two) times daily.  . lansoprazole (PREVACID) 30 MG capsule Take 1 capsule (30 mg total) by mouth daily at 12 noon.  Marland Kitchen levonorgestrel (MIRENA) 20 MCG/24HR IUD 1 each by Intrauterine route once.  Marland Kitchen losartan (COZAAR) 50 MG tablet TAKE 1 TABLET BY MOUTH  DAILY  . rosuvastatin (CRESTOR) 10 MG tablet Take 1 tablet (10 mg total) by mouth daily.  . valACYclovir (VALTREX) 1000 MG tablet Take 1 tablet (1,000 mg total) by mouth 2 (two) times daily.     Past Surgical History:  Procedure Laterality Date  . FOOT SURGERY    . WISDOM TOOTH EXTRACTION      Family History  Problem Relation Age of Onset  . Cancer Father        bladder  . Hypertension Father   . Parkinson's disease Father   . Breast cancer Neg Hx     New complaints: Pain on right side of back where kidney is. Pain is intermittent.  Social history: Lives with her husband and daughter  Controlled substance contract: n/a    Review of Systems  Constitutional: Negative for  diaphoresis.  Eyes: Negative for pain.  Respiratory: Negative for shortness of breath.   Cardiovascular: Negative for chest pain, palpitations and leg swelling.  Gastrointestinal: Negative for abdominal pain.  Endocrine: Negative for polydipsia.  Skin: Negative for rash.  Neurological: Negative for dizziness, weakness and headaches.  Hematological: Does not bruise/bleed easily.  All other systems reviewed and are negative.      Objective:   Physical Exam Vitals and nursing note reviewed.  Constitutional:      General: She is not in acute distress.    Appearance: Normal appearance. She is well-developed and well-nourished.  HENT:     Head: Normocephalic.     Nose: Nose normal.     Mouth/Throat:     Mouth: Oropharynx is clear and moist.  Eyes:     Extraocular Movements: EOM normal.     Pupils: Pupils are equal, round, and reactive to light.  Neck:     Vascular: No carotid bruit or JVD.  Cardiovascular:     Rate and Rhythm: Normal rate and regular rhythm.     Pulses: Intact distal pulses.     Heart sounds: Normal heart sounds.  Pulmonary:     Effort: Pulmonary effort is normal. No respiratory distress.     Breath sounds: Normal  breath sounds. No wheezing or rales.  Chest:     Chest wall: No tenderness.  Abdominal:     General: Bowel sounds are normal. There is no distension or abdominal bruit. Aorta is normal.     Palpations: Abdomen is soft. There is no hepatomegaly, splenomegaly, mass or pulsatile mass.     Tenderness: There is no abdominal tenderness.  Musculoskeletal:        General: No edema. Normal range of motion.     Cervical back: Normal range of motion and neck supple.  Lymphadenopathy:     Cervical: No cervical adenopathy.  Skin:    General: Skin is warm and dry.  Neurological:     Mental Status: She is alert and oriented to person, place, and time.     Deep Tendon Reflexes: Reflexes are normal and symmetric.  Psychiatric:        Mood and Affect: Mood and  affect normal.        Behavior: Behavior normal.        Thought Content: Thought content normal.        Judgment: Judgment normal.       BP 110/82   Pulse 86   Temp (!) 97.3 F (36.3 C) (Temporal)   Resp 20   Ht '5\' 6"'  (1.676 m)   Wt 233 lb (105.7 kg)   SpO2 96%   BMI 37.61 kg/m       Assessment & Plan:  Michelle Blake comes in today with chief complaint of Medical Management of Chronic Issues   Diagnosis and orders addressed:  1. Essential hypertension Low sodium diet - CBC with Differential/Platelet - CMP14+EGFR - Lipid panel - Thyroid Panel With TSH - losartan (COZAAR) 50 MG tablet; Take 1 tablet (50 mg total) by mouth daily.  Dispense: 90 tablet; Refill: 1 - rosuvastatin (CRESTOR) 10 MG tablet; Take 1 tablet (10 mg total) by mouth daily.  Dispense: 90 tablet; Refill: 1  2. Current smoker Smoking cessation encouraged  3. Herpes simplex type 1 infection Valtrex as needed  4. Obesity (BMI 30-39.9) Discussed diet and exercise for person with BMI >25 Will recheck weight in 3-6 months  5. Right flank pain - Urinalysis, Complete  6. Gastroesophageal reflux disease without esophagitis Avoid spicy foods Do not eat 2 hours prior to bedtime - lansoprazole (PREVACID) 30 MG capsule; Take 1 capsule (30 mg total) by mouth daily at 12 noon.  Dispense: 30 capsule; Refill: 3   Labs pending Health Maintenance reviewed Diet and exercise encouraged  Follow up plan: 6 months   Mary-Margaret Hassell Done, FNP

## 2020-09-17 NOTE — Patient Instructions (Signed)

## 2020-09-18 LAB — CBC WITH DIFFERENTIAL/PLATELET
Basophils Absolute: 0 10*3/uL (ref 0.0–0.2)
Basos: 0 %
EOS (ABSOLUTE): 0.1 10*3/uL (ref 0.0–0.4)
Eos: 1 %
Hematocrit: 43 % (ref 34.0–46.6)
Hemoglobin: 14.4 g/dL (ref 11.1–15.9)
Immature Grans (Abs): 0 10*3/uL (ref 0.0–0.1)
Immature Granulocytes: 0 %
Lymphocytes Absolute: 2.3 10*3/uL (ref 0.7–3.1)
Lymphs: 22 %
MCH: 31.8 pg (ref 26.6–33.0)
MCHC: 33.5 g/dL (ref 31.5–35.7)
MCV: 95 fL (ref 79–97)
Monocytes Absolute: 0.6 10*3/uL (ref 0.1–0.9)
Monocytes: 6 %
Neutrophils Absolute: 7.4 10*3/uL — ABNORMAL HIGH (ref 1.4–7.0)
Neutrophils: 71 %
Platelets: 316 10*3/uL (ref 150–450)
RBC: 4.53 x10E6/uL (ref 3.77–5.28)
RDW: 12.2 % (ref 11.7–15.4)
WBC: 10.5 10*3/uL (ref 3.4–10.8)

## 2020-09-18 LAB — THYROID PANEL WITH TSH
Free Thyroxine Index: 1.9 (ref 1.2–4.9)
T3 Uptake Ratio: 26 % (ref 24–39)
T4, Total: 7.3 ug/dL (ref 4.5–12.0)
TSH: 0.905 u[IU]/mL (ref 0.450–4.500)

## 2020-09-18 LAB — CMP14+EGFR
ALT: 16 IU/L (ref 0–32)
AST: 17 IU/L (ref 0–40)
Albumin/Globulin Ratio: 1.7 (ref 1.2–2.2)
Albumin: 3.8 g/dL (ref 3.8–4.8)
Alkaline Phosphatase: 81 IU/L (ref 44–121)
BUN/Creatinine Ratio: 8 — ABNORMAL LOW (ref 9–23)
BUN: 8 mg/dL (ref 6–24)
Bilirubin Total: <0.2 mg/dL (ref 0.0–1.2)
CO2: 22 mmol/L (ref 20–29)
Calcium: 8.7 mg/dL (ref 8.7–10.2)
Chloride: 106 mmol/L (ref 96–106)
Creatinine, Ser: 1 mg/dL (ref 0.57–1.00)
GFR calc Af Amer: 76 mL/min/{1.73_m2} (ref >59)
GFR calc non Af Amer: 66 mL/min/{1.73_m2} (ref >59)
Globulin, Total: 2.3 g/dL (ref 1.5–4.5)
Glucose: 101 mg/dL — ABNORMAL HIGH (ref 65–99)
Potassium: 4.6 mmol/L (ref 3.5–5.2)
Sodium: 142 mmol/L (ref 134–144)
Total Protein: 6.1 g/dL (ref 6.0–8.5)

## 2020-09-18 LAB — LIPID PANEL
Chol/HDL Ratio: 4.4 ratio (ref 0.0–4.4)
Cholesterol, Total: 145 mg/dL (ref 100–199)
HDL: 33 mg/dL — ABNORMAL LOW (ref >39)
LDL Chol Calc (NIH): 86 mg/dL (ref 0–99)
Triglycerides: 148 mg/dL (ref 0–149)
VLDL Cholesterol Cal: 26 mg/dL (ref 5–40)

## 2021-02-18 ENCOUNTER — Other Ambulatory Visit: Payer: Self-pay

## 2021-02-18 ENCOUNTER — Other Ambulatory Visit: Payer: Self-pay | Admitting: Family Medicine

## 2021-02-18 ENCOUNTER — Encounter: Payer: Self-pay | Admitting: Nurse Practitioner

## 2021-02-18 ENCOUNTER — Ambulatory Visit (INDEPENDENT_AMBULATORY_CARE_PROVIDER_SITE_OTHER): Payer: BC Managed Care – PPO | Admitting: Nurse Practitioner

## 2021-02-18 VITALS — BP 128/87 | HR 85 | Temp 97.3°F | Ht 66.0 in | Wt 234.0 lb

## 2021-02-18 DIAGNOSIS — E8881 Metabolic syndrome: Secondary | ICD-10-CM

## 2021-02-18 DIAGNOSIS — K219 Gastro-esophageal reflux disease without esophagitis: Secondary | ICD-10-CM

## 2021-02-18 DIAGNOSIS — B009 Herpesviral infection, unspecified: Secondary | ICD-10-CM | POA: Diagnosis not present

## 2021-02-18 DIAGNOSIS — F17200 Nicotine dependence, unspecified, uncomplicated: Secondary | ICD-10-CM

## 2021-02-18 DIAGNOSIS — I10 Essential (primary) hypertension: Secondary | ICD-10-CM | POA: Diagnosis not present

## 2021-02-18 DIAGNOSIS — E669 Obesity, unspecified: Secondary | ICD-10-CM

## 2021-02-18 LAB — CBC WITH DIFFERENTIAL/PLATELET
Basophils Absolute: 0 10*3/uL (ref 0.0–0.2)
Basos: 0 %
EOS (ABSOLUTE): 0.2 10*3/uL (ref 0.0–0.4)
Eos: 1 %
Hematocrit: 46.3 % (ref 34.0–46.6)
Hemoglobin: 15.4 g/dL (ref 11.1–15.9)
Immature Grans (Abs): 0.1 10*3/uL (ref 0.0–0.1)
Immature Granulocytes: 1 %
Lymphocytes Absolute: 2.6 10*3/uL (ref 0.7–3.1)
Lymphs: 21 %
MCH: 32 pg (ref 26.6–33.0)
MCHC: 33.3 g/dL (ref 31.5–35.7)
MCV: 96 fL (ref 79–97)
Monocytes Absolute: 0.8 10*3/uL (ref 0.1–0.9)
Monocytes: 6 %
Neutrophils Absolute: 8.9 10*3/uL — ABNORMAL HIGH (ref 1.4–7.0)
Neutrophils: 71 %
Platelets: 261 10*3/uL (ref 150–450)
RBC: 4.81 x10E6/uL (ref 3.77–5.28)
RDW: 12.5 % (ref 11.7–15.4)
WBC: 12.5 10*3/uL — ABNORMAL HIGH (ref 3.4–10.8)

## 2021-02-18 LAB — CMP14+EGFR
ALT: 13 IU/L (ref 0–32)
AST: 13 IU/L (ref 0–40)
Albumin/Globulin Ratio: 1.6 (ref 1.2–2.2)
Albumin: 4.2 g/dL (ref 3.8–4.8)
Alkaline Phosphatase: 95 IU/L (ref 44–121)
BUN/Creatinine Ratio: 10 (ref 9–23)
BUN: 10 mg/dL (ref 6–24)
Bilirubin Total: 0.2 mg/dL (ref 0.0–1.2)
CO2: 23 mmol/L (ref 20–29)
Calcium: 9.1 mg/dL (ref 8.7–10.2)
Chloride: 105 mmol/L (ref 96–106)
Creatinine, Ser: 0.98 mg/dL (ref 0.57–1.00)
Globulin, Total: 2.6 g/dL (ref 1.5–4.5)
Glucose: 95 mg/dL (ref 65–99)
Potassium: 4.8 mmol/L (ref 3.5–5.2)
Sodium: 140 mmol/L (ref 134–144)
Total Protein: 6.8 g/dL (ref 6.0–8.5)
eGFR: 71 mL/min/{1.73_m2} (ref >59)

## 2021-02-18 LAB — LIPID PANEL
Chol/HDL Ratio: 3.8 ratio (ref 0.0–4.4)
Cholesterol, Total: 143 mg/dL (ref 100–199)
HDL: 38 mg/dL — ABNORMAL LOW (ref >39)
LDL Chol Calc (NIH): 81 mg/dL (ref 0–99)
Triglycerides: 133 mg/dL (ref 0–149)
VLDL Cholesterol Cal: 24 mg/dL (ref 5–40)

## 2021-02-18 MED ORDER — ROSUVASTATIN CALCIUM 10 MG PO TABS: 10.0000 mg | ORAL_TABLET | Freq: Every day | ORAL | 1 refills | Status: DC

## 2021-02-18 MED ORDER — OZEMPIC (0.25 OR 0.5 MG/DOSE) 2 MG/1.5ML ~~LOC~~ SOPN: 0.5000 mg | PEN_INJECTOR | SUBCUTANEOUS | 3 refills | Status: DC

## 2021-02-18 MED ORDER — LOSARTAN POTASSIUM 50 MG PO TABS: 50.0000 mg | ORAL_TABLET | Freq: Every day | ORAL | 1 refills | Status: DC

## 2021-02-18 MED ORDER — VALACYCLOVIR HCL 1 G PO TABS: 1000.0000 mg | ORAL_TABLET | Freq: Two times a day (BID) | ORAL | 1 refills | Status: DC

## 2021-02-18 MED ORDER — LANSOPRAZOLE 30 MG PO CPDR: 30.0000 mg | DELAYED_RELEASE_CAPSULE | Freq: Every day | ORAL | 3 refills | Status: DC

## 2021-02-18 NOTE — Patient Instructions (Signed)
Metabolic Syndrome Metabolic syndrome occurs when you have a combination of three or more factors that increase your chances of developing cardiovascular disease and diabetes. These factors include:  High fasting blood sugar (glucose).  High blood triglyceride level.  High blood pressure.  Low levels of high-density lipoprotein (HDL) blood cholesterol.  Having a waist measurement that is: ? More than 40 inches in men. ? More than 35 inches in women. Metabolic syndrome is sometimes called insulin resistance syndrome or syndrome X. What are the causes? The exact cause of this condition is not known. It may be related to a combination of the factors that were passed down from your parents (genes) and the things that you do, eat, and drink (lifestyle choices). What increases the risk? You are more likely to develop this condition if you:  Eat a diet that is high in calories and saturated fat.  Do not exercise regularly.  Are obese.  Have a family history of type 2 diabetes mellitus.  Have insulin resistance.  Have a history of gestational diabetes during a previous pregnancy.  Have conditions such as cardiovascular disease, nonalcoholic fatty liver disease, or polycystic ovary syndrome (PCOS).  Are older. The risk increases with age.  Use any tobacco products, including cigarettes, chewing tobacco, or e-cigarettes. What are the signs or symptoms? Metabolic syndrome has no specific symptoms. Having abnormal blood test results may be the only sign of metabolic syndrome. How is this diagnosed? This condition may be diagnosed based on:  Your blood pressure measurements.  Your waist measurement.  Blood tests.  Your personal and family medical history. How is this treated? Treatment may include:  Lifestyle changes to reduce your risk for heart disease, stroke, and diabetes. These include: ? Exercise. ? Weight loss. ? Eating a healthy diet. ? Stopping tobacco and nicotine  use.  Medicines that: ? Help your body maintain normal blood glucose levels. ? Lower your blood pressure and your blood triglyceride levels. You may be referred to a behavioral counselor who will help you make lifestyle changes that will lower your risk for serious disease. Follow these instructions at home: Lifestyle  Exercise regularly, as told by your health care provider.  Eat a healthy diet that includes fresh fruits and vegetables, whole grains, lean proteins, and low-fat or nonfat dairy products.  Do not use any products that contain nicotine or tobacco, such as cigarettes, e-cigarettes, and chewing tobacco. If you need help quitting, ask your health care provider.  Maintain a healthy weight. Work with your health care provider to lose weight safely, if needed.      General instructions  Take over-the-counter and prescription medicines only as told by your health care provider.  If directed, measure your waist regularly and write down the measurements. To measure your waist: ? Stand up straight. ? Breathe out. ? Wrap a measuring tape around the part of your waist that is just above your hip bones. ? Read and write down the measurement.  Keep all follow-up visits. This is important. Contact a health care provider if:  You feel very tired.  You are extremely thirsty.  You urinate a lot more than usual.  Your waist gets bigger.  You have headaches that do not go away. Get help right away if:  You suddenly develop any of the following: ? Dizziness. ? Blurry vision. ? Trouble speaking. ? Trouble swallowing. ? Weakness in an arm or leg. ? Chest pain. ? Trouble breathing.  Your heartbeat feels abnormal.  You   faint. Summary  Metabolic syndrome occurs when you have a combination of three or more factors that increase your chances of developing cardiovascular disease and diabetes.  These factors include high fasting blood sugar (glucose), high blood triglyceride  level, high blood pressure, low levels of high-density lipoprotein (HDL) blood cholesterol, and a waist measurement that is more than 40 inches in men or more than 35 inches in women.  Metabolic syndrome has no specific symptoms. Having abnormal blood test results may be the only signs of metabolic syndrome.  Treatment may include lifestyle changes and medicines to reduce your risk for heart disease, stroke, and diabetes. This information is not intended to replace advice given to you by your health care provider. Make sure you discuss any questions you have with your health care provider. Document Revised: 01/29/2020 Document Reviewed: 01/29/2020 Elsevier Patient Education  2021 Elsevier Inc.  

## 2021-02-18 NOTE — Progress Notes (Signed)
Subjective:    Patient ID: Michelle Blake, female    DOB: 1971-04-08, 50 y.o.   MRN: [id number]   Chief Complaint: Hypertension    HPI:  1. Essential hypertension No c/o chest pain, sob or headache. Does not check blod pressure at home.] BP Readings from Last 3 Encounters:  09/17/20 110/82  04/22/20 (!) 138/86  03/14/20 (!) 151/87     2. Gastroesophageal reflux disease without esophagitis Is on prevacid daily and is working well for her. Has occasional break through symptoms.  3. Herpes simplex type 1 infection Is on valtrex as needed.  4. Current smoker Is still smoking over a pack a day.  5. Obesity (BMI 30-39.9) No recent weight changes Wt Readings from Last 3 Encounters:  02/18/21 234 lb (106.1 kg)  09/17/20 233 lb (105.7 kg)  04/22/20 (!) 241 lb (109.3 kg)   BMI Readings from Last 3 Encounters:  02/18/21 37.77 kg/m  09/17/20 37.61 kg/m  04/22/20 38.90 kg/m       Outpatient Encounter Medications as of 02/18/2021  Medication Sig  . aspirin EC 81 MG tablet Take 81 mg by mouth daily. Swallow whole.  . lansoprazole (PREVACID) 30 MG capsule Take 1 capsule (30 mg total) by mouth daily at 12 noon.  Marland Kitchen levonorgestrel (MIRENA) 20 MCG/24HR IUD 1 each by Intrauterine route once.  Marland Kitchen losartan (COZAAR) 50 MG tablet Take 1 tablet (50 mg total) by mouth daily.  . rosuvastatin (CRESTOR) 10 MG tablet Take 1 tablet (10 mg total) by mouth daily.  . valACYclovir (VALTREX) 1000 MG tablet Take 1 tablet (1,000 mg total) by mouth 2 (two) times daily.   No facility-administered encounter medications on file as of 02/18/2021.    Past Surgical History:  Procedure Laterality Date  . FOOT SURGERY    . WISDOM TOOTH EXTRACTION      Family History  Problem Relation Age of Onset  . Cancer Father        bladder  . Hypertension Father   . Parkinson's disease Father   . Breast cancer Neg Hx     New complaints: None today  Social history: Lives with husband. Works at a  Scientist, research (physical sciences): n/a    Review of Systems  Constitutional: Negative for diaphoresis.  Eyes: Negative for pain.  Respiratory: Negative for shortness of breath.   Cardiovascular: Negative for chest pain, palpitations and leg swelling.  Gastrointestinal: Negative for abdominal pain.  Endocrine: Negative for polydipsia.  Skin: Negative for rash.  Neurological: Negative for dizziness, weakness and headaches.  Hematological: Does not bruise/bleed easily.  All other systems reviewed and are negative.      Objective:   Physical Exam Vitals and nursing note reviewed.  Constitutional:      General: She is not in acute distress.    Appearance: Normal appearance. She is well-developed.  HENT:     Head: Normocephalic.     Nose: Nose normal.  Eyes:     Pupils: Pupils are equal, round, and reactive to light.  Neck:     Vascular: No carotid bruit or JVD.  Cardiovascular:     Rate and Rhythm: Normal rate and regular rhythm.     Heart sounds: Normal heart sounds.  Pulmonary:     Effort: Pulmonary effort is normal. No respiratory distress.     Breath sounds: Normal breath sounds. No wheezing or rales.  Chest:     Chest wall: No tenderness.  Abdominal:     General:  Bowel sounds are normal. There is no distension or abdominal bruit.     Palpations: Abdomen is soft. There is no hepatomegaly, splenomegaly, mass or pulsatile mass.     Tenderness: There is no abdominal tenderness.  Musculoskeletal:        General: Normal range of motion.     Cervical back: Normal range of motion and neck supple.  Lymphadenopathy:     Cervical: No cervical adenopathy.  Skin:    General: Skin is warm and dry.  Neurological:     Mental Status: She is alert and oriented to person, place, and time.     Deep Tendon Reflexes: Reflexes are normal and symmetric.  Psychiatric:        Behavior: Behavior normal.        Thought Content: Thought content normal.        Judgment: Judgment  normal.    BP 128/87   Pulse 85   Temp (!) 97.3 F (36.3 C) (Temporal)   Ht '5\' 6"'  (1.676 m)   Wt 234 lb (106.1 kg)   SpO2 97%   BMI 37.77 kg/m        Assessment & Plan:  [name] comes in today with chief complaint of Hypertension   Diagnosis and orders addressed:  1. Essential hypertension Low sodium diet - losartan (COZAAR) 50 MG tablet; Take 1 tablet (50 mg total) by mouth daily.  Dispense: 90 tablet; Refill: 1 - rosuvastatin (CRESTOR) 10 MG tablet; Take 1 tablet (10 mg total) by mouth daily.  Dispense: 90 tablet; Refill: 1 - CBC with Differential/Platelet - CMP14+EGFR - Lipid panel  2. Gastroesophageal reflux disease without esophagitis Avoid spicy foods Do not eat 2 hours prior to bedtime - lansoprazole (PREVACID) 30 MG capsule; Take 1 capsule (30 mg total) by mouth daily at 12 noon.  Dispense: 30 capsule; Refill: 3  3. Herpes simplex type 1 infection  - valACYclovir (VALTREX) 1000 MG tablet; Take 1 tablet (1,000 mg total) by mouth 2 (two) times daily.  Dispense: 30 tablet; Refill: 1  4. Current smoker Smoking cessation encouraged  5. Obesity (BMI 30-39.9) Discussed diet and exercise for person with BMI >25 Will recheck weight in 3-6 months  6. Metabolic syndrome May need prior auth for medication. - Semaglutide,0.25 or 0.5MG/DOS, (OZEMPIC, 0.25 OR 0.5 MG/DOSE,) 2 MG/1.5ML SOPN; Inject 0.5 mg into the skin once a week.  Dispense: 1.5 mL; Refill: 3   Labs pending Health Maintenance reviewed Diet and exercise encouraged  Follow up plan: 6 month   [place], FNP

## 2021-04-09 DIAGNOSIS — Z6837 Body mass index (BMI) 37.0-37.9, adult: Secondary | ICD-10-CM | POA: Diagnosis not present

## 2021-04-09 DIAGNOSIS — J329 Chronic sinusitis, unspecified: Secondary | ICD-10-CM | POA: Diagnosis not present

## 2021-04-09 DIAGNOSIS — J441 Chronic obstructive pulmonary disease with (acute) exacerbation: Secondary | ICD-10-CM | POA: Diagnosis not present

## 2021-04-09 DIAGNOSIS — R059 Cough, unspecified: Secondary | ICD-10-CM | POA: Diagnosis not present

## 2021-07-25 IMAGING — MG DIGITAL SCREENING BILAT W/ TOMO W/ CAD
8 series · 8 of 24 positions shown · non-contrast
Comparison: Previous exam(s).

CLINICAL DATA: Screening.

EXAM:
DIGITAL SCREENING BILATERAL MAMMOGRAM WITH TOMO AND CAD

[R MLO synth-2D]
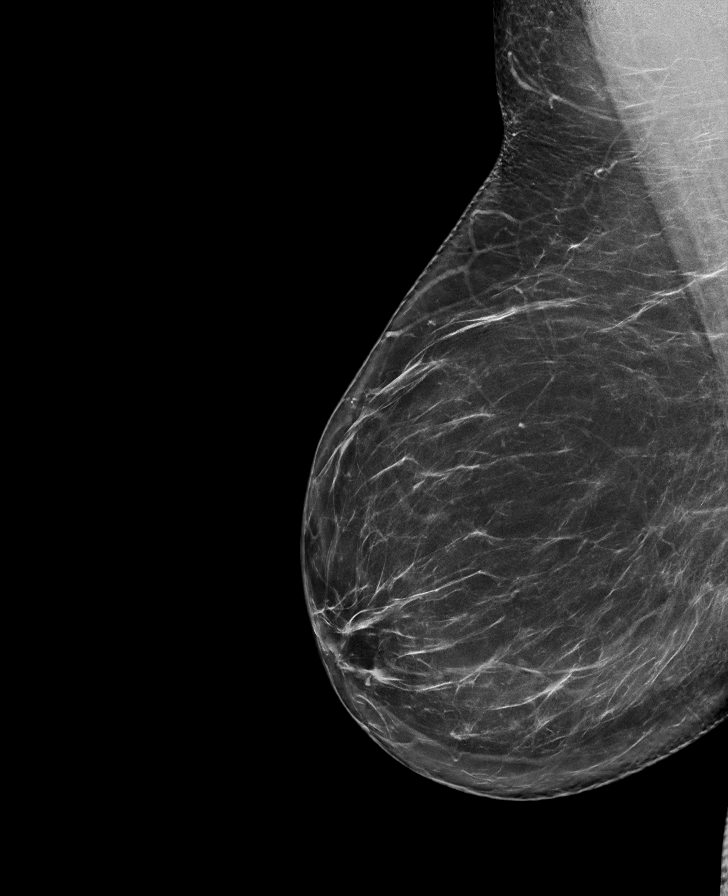

[L CC synth-2D]
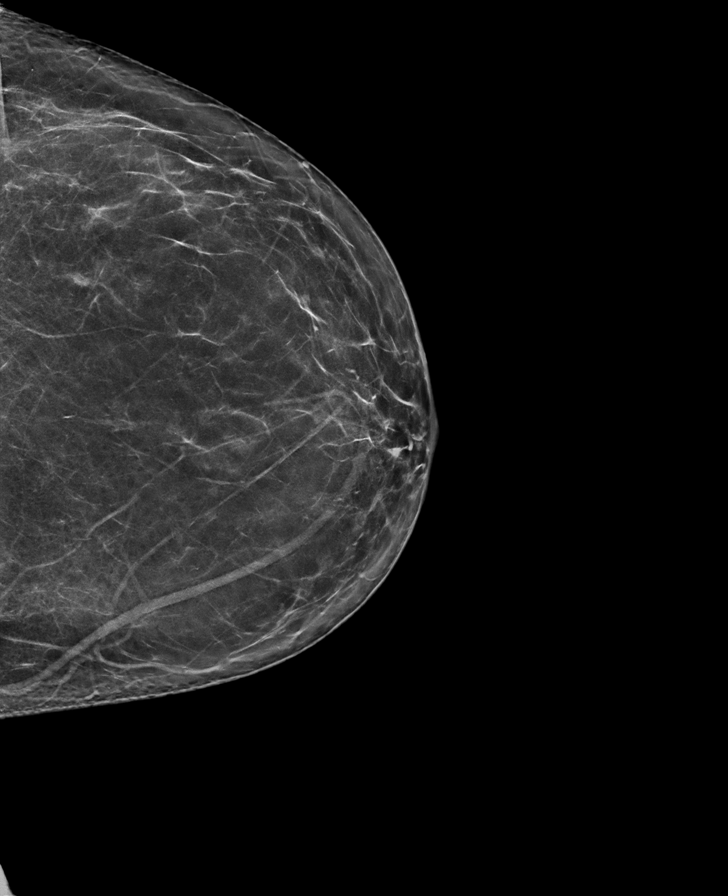

[R CC synth-2D]
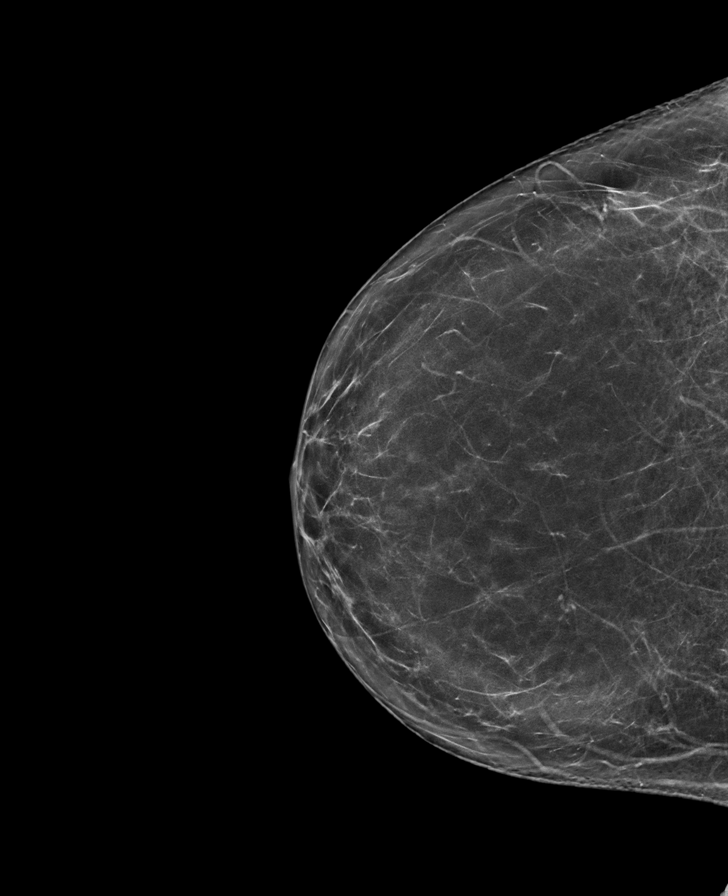

[L MLO synth-2D]
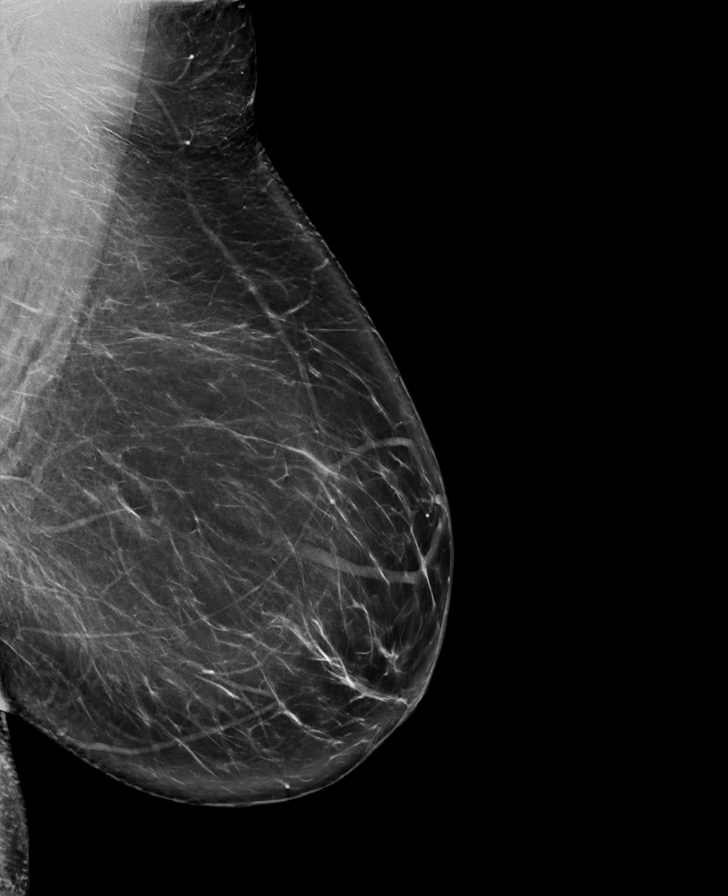

[R CC tomo · tomo slice 37/72.0]
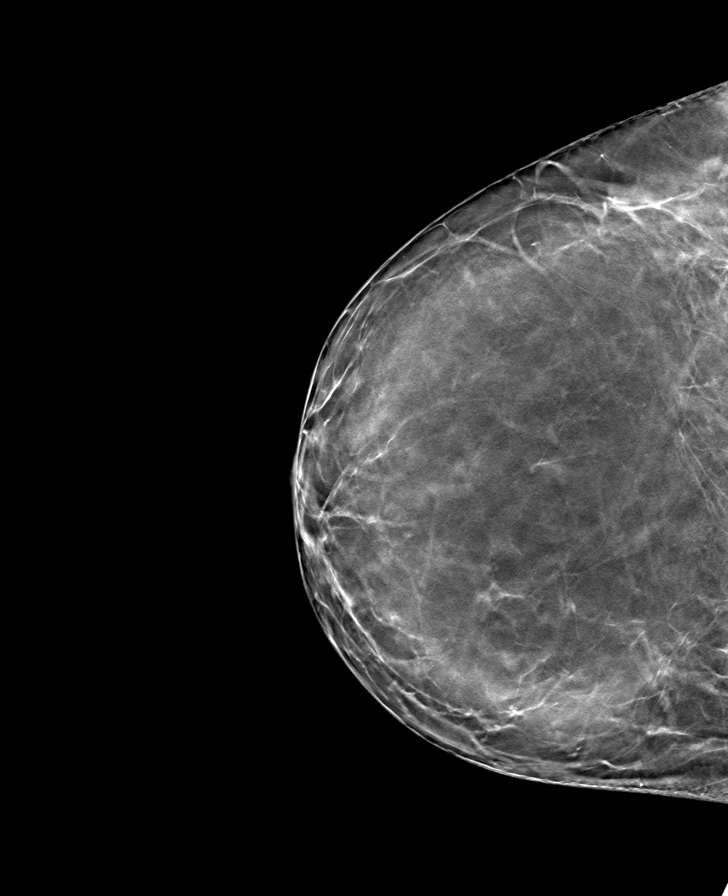

[L CC tomo · tomo slice 38/75.0]
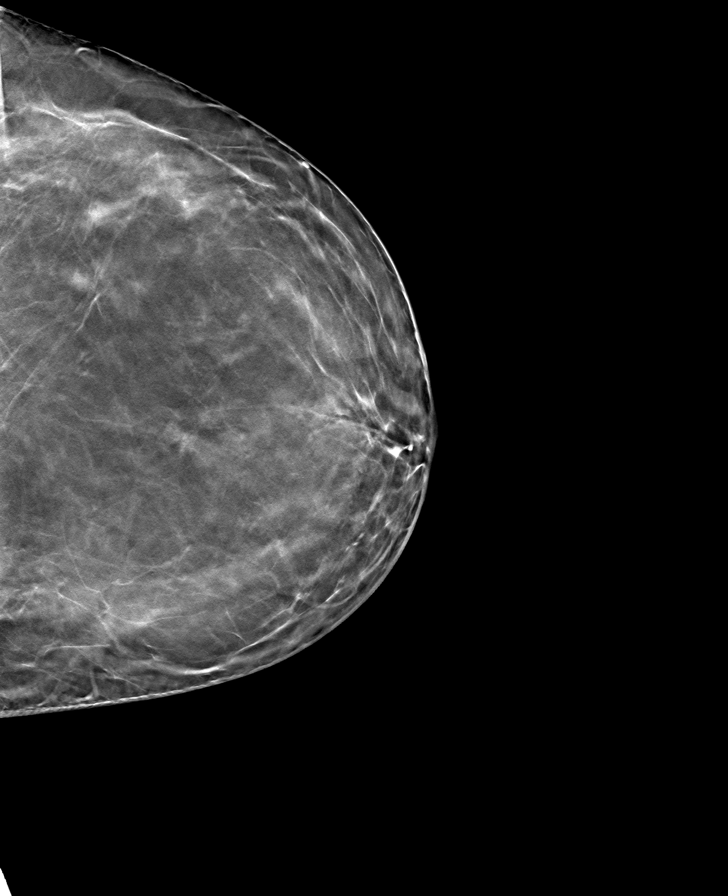

[R MLO tomo · tomo slice 43/85.0]
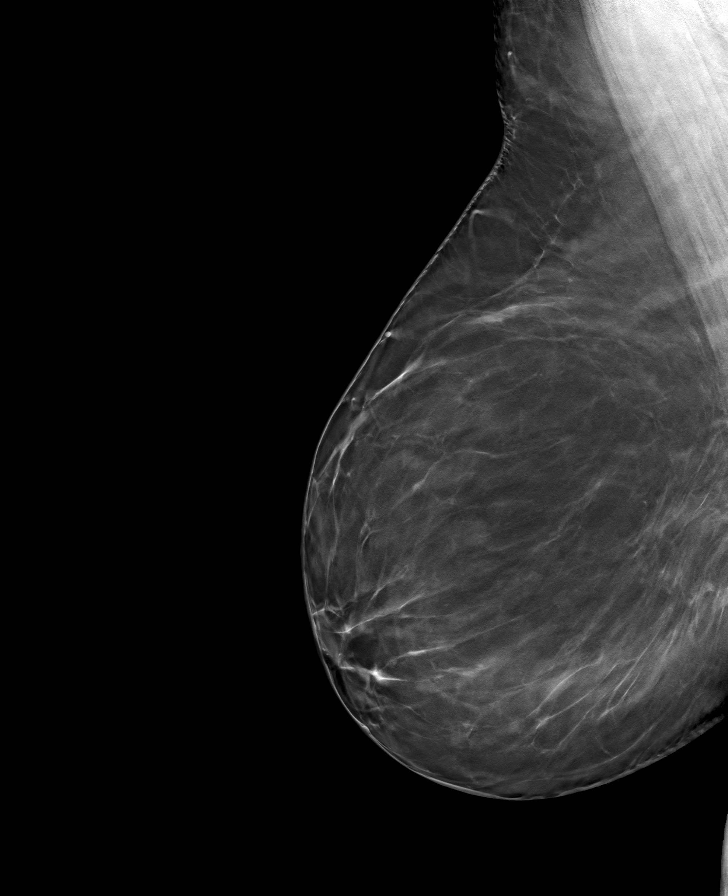

[L MLO tomo · tomo slice 47/93.0]
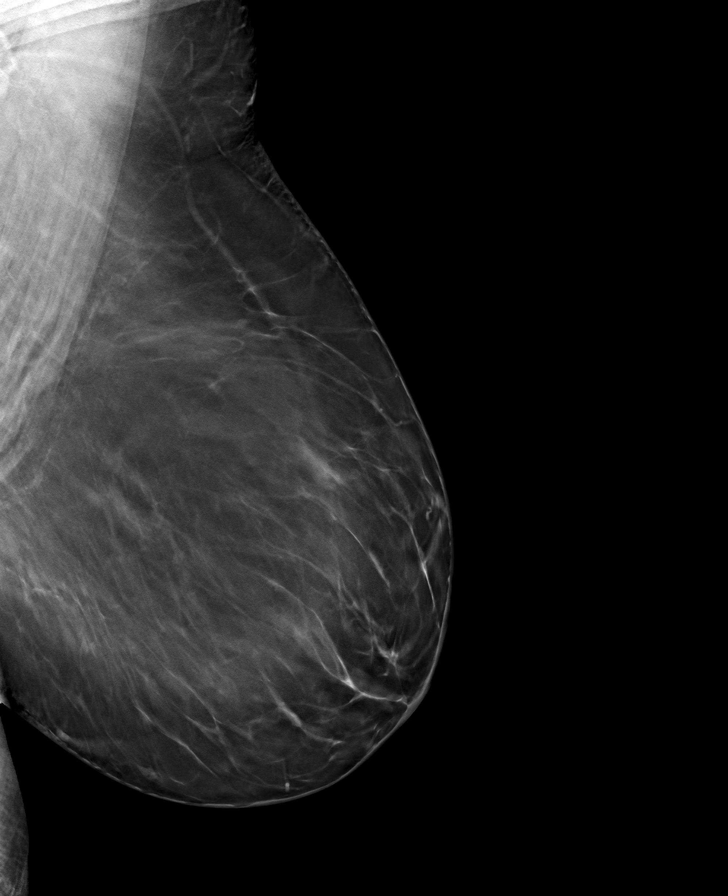

[8 of 24 positions shown; findings below may reference images not displayed]

ACR Breast Density Category b: There are scattered areas of
fibroglandular density.
FINDINGS: There are no findings suspicious for malignancy. Images were
processed with CAD.
IMPRESSION: No mammographic evidence of malignancy. A result letter of this
screening mammogram will be mailed directly to the patient.

RECOMMENDATION:
Screening mammogram in one year. (Code:[id number])

BI-RADS CATEGORY  1: Negative.

## 2021-08-13 ENCOUNTER — Other Ambulatory Visit: Payer: Self-pay | Admitting: Nurse Practitioner

## 2021-08-13 DIAGNOSIS — I10 Essential (primary) hypertension: Secondary | ICD-10-CM

## 2021-08-20 ENCOUNTER — Ambulatory Visit: Payer: Self-pay | Admitting: Nurse Practitioner

## 2021-08-28 ENCOUNTER — Other Ambulatory Visit: Payer: Self-pay | Admitting: Nurse Practitioner

## 2021-08-28 DIAGNOSIS — I10 Essential (primary) hypertension: Secondary | ICD-10-CM

## 2021-09-04 ENCOUNTER — Ambulatory Visit (INDEPENDENT_AMBULATORY_CARE_PROVIDER_SITE_OTHER): Payer: BC Managed Care – PPO | Admitting: Nurse Practitioner

## 2021-09-04 ENCOUNTER — Encounter: Payer: Self-pay | Admitting: Nurse Practitioner

## 2021-09-04 VITALS — BP 124/90 | HR 86 | Temp 97.8°F | Resp 20 | Ht 66.0 in | Wt 236.0 lb

## 2021-09-04 DIAGNOSIS — E669 Obesity, unspecified: Secondary | ICD-10-CM | POA: Diagnosis not present

## 2021-09-04 DIAGNOSIS — F17200 Nicotine dependence, unspecified, uncomplicated: Secondary | ICD-10-CM

## 2021-09-04 DIAGNOSIS — E8881 Metabolic syndrome: Secondary | ICD-10-CM

## 2021-09-04 DIAGNOSIS — Z23 Encounter for immunization: Secondary | ICD-10-CM | POA: Diagnosis not present

## 2021-09-04 DIAGNOSIS — I10 Essential (primary) hypertension: Secondary | ICD-10-CM

## 2021-09-04 DIAGNOSIS — R200 Anesthesia of skin: Secondary | ICD-10-CM

## 2021-09-04 DIAGNOSIS — K219 Gastro-esophageal reflux disease without esophagitis: Secondary | ICD-10-CM | POA: Diagnosis not present

## 2021-09-04 LAB — CBC WITH DIFFERENTIAL/PLATELET
Basophils Absolute: 0 10*3/uL (ref 0.0–0.2)
Basos: 0 %
EOS (ABSOLUTE): 0.2 10*3/uL (ref 0.0–0.4)
Eos: 2 %
Hematocrit: 42.9 % (ref 34.0–46.6)
Hemoglobin: 14.6 g/dL (ref 11.1–15.9)
Immature Grans (Abs): 0 10*3/uL (ref 0.0–0.1)
Immature Granulocytes: 0 %
Lymphocytes Absolute: 2.3 10*3/uL (ref 0.7–3.1)
Lymphs: 26 %
MCH: 31.9 pg (ref 26.6–33.0)
MCHC: 34 g/dL (ref 31.5–35.7)
MCV: 94 fL (ref 79–97)
Monocytes Absolute: 0.5 10*3/uL (ref 0.1–0.9)
Monocytes: 6 %
Neutrophils Absolute: 5.6 10*3/uL (ref 1.4–7.0)
Neutrophils: 66 %
Platelets: 279 10*3/uL (ref 150–450)
RBC: 4.58 x10E6/uL (ref 3.77–5.28)
RDW: 12.3 % (ref 11.7–15.4)
WBC: 8.6 10*3/uL (ref 3.4–10.8)

## 2021-09-04 LAB — CMP14+EGFR
ALT: 14 IU/L (ref 0–32)
AST: 17 IU/L (ref 0–40)
Albumin/Globulin Ratio: 1.8 (ref 1.2–2.2)
Albumin: 4.2 g/dL (ref 3.8–4.8)
Alkaline Phosphatase: 89 IU/L (ref 44–121)
BUN/Creatinine Ratio: 11 (ref 9–23)
BUN: 11 mg/dL (ref 6–24)
Bilirubin Total: 0.2 mg/dL (ref 0.0–1.2)
CO2: 22 mmol/L (ref 20–29)
Calcium: 8.8 mg/dL (ref 8.7–10.2)
Chloride: 105 mmol/L (ref 96–106)
Creatinine, Ser: 1.01 mg/dL — ABNORMAL HIGH (ref 0.57–1.00)
Globulin, Total: 2.4 g/dL (ref 1.5–4.5)
Glucose: 106 mg/dL — ABNORMAL HIGH (ref 70–99)
Potassium: 4.5 mmol/L (ref 3.5–5.2)
Sodium: 140 mmol/L (ref 134–144)
Total Protein: 6.6 g/dL (ref 6.0–8.5)
eGFR: 68 mL/min/{1.73_m2} (ref >59)

## 2021-09-04 LAB — BAYER DCA HB A1C WAIVED: HB A1C (BAYER DCA - WAIVED): 6.1 % — ABNORMAL HIGH (ref 4.8–5.6)

## 2021-09-04 LAB — LIPID PANEL
Chol/HDL Ratio: 3.8 ratio (ref 0.0–4.4)
Cholesterol, Total: 139 mg/dL (ref 100–199)
HDL: 37 mg/dL — ABNORMAL LOW (ref >39)
LDL Chol Calc (NIH): 81 mg/dL (ref 0–99)
Triglycerides: 117 mg/dL (ref 0–149)
VLDL Cholesterol Cal: 21 mg/dL (ref 5–40)

## 2021-09-04 MED ORDER — PREDNISONE 10 MG (21) PO TBPK: ORAL_TABLET | ORAL | 0 refills | Status: DC

## 2021-09-04 MED ORDER — OZEMPIC (0.25 OR 0.5 MG/DOSE) 2 MG/1.5ML ~~LOC~~ SOPN: 0.5000 mg | PEN_INJECTOR | SUBCUTANEOUS | 3 refills | Status: DC

## 2021-09-04 MED ORDER — ROSUVASTATIN CALCIUM 10 MG PO TABS: 10.0000 mg | ORAL_TABLET | Freq: Every day | ORAL | 1 refills | Status: DC

## 2021-09-04 MED ORDER — LOSARTAN POTASSIUM 50 MG PO TABS
50.0000 mg | ORAL_TABLET | Freq: Every day | ORAL | 1 refills | Status: DC
Start: 2021-09-04 — End: 2022-01-28

## 2021-09-04 MED ORDER — LANSOPRAZOLE 30 MG PO CPDR: 30.0000 mg | DELAYED_RELEASE_CAPSULE | Freq: Every day | ORAL | 3 refills | Status: DC

## 2021-09-04 NOTE — Addendum Note (Signed)
Addended by: Bennie Pierini on: 09/04/2021 10:09 AM   Modules accepted: Orders

## 2021-09-04 NOTE — Progress Notes (Signed)
Subjective:    Patient ID: Michelle Blake, female    DOB: Aug 01, 1971, 50 y.o.   MRN: [id number]  Chief Complaint: Annual Exam (No pap     Pain and 2 fingers on left hand and toes on left foot)    HPI:  1. Essential hypertension No c/o chest pain, sob or headache. Doe snot check blood pressure at home. BP Readings from Last 3 Encounters:  09/04/21 124/90  02/18/21 128/87  09/17/20 110/82     2. Gastroesophageal reflux disease without esophagitis Is on prevacid as needed OTC.  3. Current smoker Smokes over a pack a day- has no desire to quit.  4. Obesity (BMI 30-39.9) Weight is up 3 lbs Wt Readings from Last 3 Encounters:  09/04/21 236 lb (107 kg)  02/18/21 234 lb (106.1 kg)  09/17/20 233 lb (105.7 kg)   BMI Readings from Last 3 Encounters:  09/04/21 38.09 kg/m  02/18/21 37.77 kg/m  09/17/20 37.61 kg/m       Outpatient Encounter Medications as of 09/04/2021  Medication Sig   aspirin EC 81 MG tablet Take 81 mg by mouth daily. Swallow whole.   lansoprazole (PREVACID) 30 MG capsule Take 1 capsule (30 mg total) by mouth daily at 12 noon.   levonorgestrel (MIRENA) 20 MCG/24HR IUD 1 each by Intrauterine route once.   losartan (COZAAR) 50 MG tablet TAKE 1 TABLET BY MOUTH  DAILY   rosuvastatin (CRESTOR) 10 MG tablet TAKE 1 TABLET BY MOUTH  DAILY   Semaglutide,0.25 or 0.5MG /DOS, (OZEMPIC, 0.25 OR 0.5 MG/DOSE,) 2 MG/1.5ML SOPN Inject 0.5 mg into the skin once a week.   valACYclovir (VALTREX) 1000 MG tablet Take 1 tablet (1,000 mg total) by mouth 2 (two) times daily.   No facility-administered encounter medications on file as of 09/04/2021.    Past Surgical History:  Procedure Laterality Date   FOOT SURGERY     WISDOM TOOTH EXTRACTION      Family History  Problem Relation Age of Onset   Cancer Father        bladder   Hypertension Father    Parkinson's disease Father    Breast cancer Neg Hx     New complaints: Left 4th and 5th finger go numb and hurt  mainly at night.  Social history: Lives with husband and children  Controlled substance contract: n/a     Review of Systems  Constitutional:  Negative for diaphoresis.  Eyes:  Negative for pain.  Respiratory:  Negative for shortness of breath.   Cardiovascular:  Negative for chest pain, palpitations and leg swelling.  Gastrointestinal:  Negative for abdominal pain.  Endocrine: Negative for polydipsia.  Skin:  Negative for rash.  Neurological:  Negative for dizziness, weakness and headaches.  Hematological:  Does not bruise/bleed easily.  All other systems reviewed and are negative.     Objective:   Physical Exam Vitals and nursing note reviewed.  Constitutional:      General: She is not in acute distress.    Appearance: Normal appearance. She is well-developed.  HENT:     Head: Normocephalic.     Right Ear: Tympanic membrane normal.     Left Ear: Tympanic membrane normal.     Nose: Nose normal.     Mouth/Throat:     Mouth: Mucous membranes are moist.  Eyes:     Pupils: Pupils are equal, round, and reactive to light.  Neck:     Vascular: No carotid bruit or JVD.  Cardiovascular:  Rate and Rhythm: Normal rate and regular rhythm.     Heart sounds: Normal heart sounds.  Pulmonary:     Effort: Pulmonary effort is normal. No respiratory distress.     Breath sounds: Normal breath sounds. No wheezing or rales.  Chest:     Chest wall: No tenderness.  Abdominal:     General: Bowel sounds are normal. There is no distension or abdominal bruit.     Palpations: Abdomen is soft. There is no hepatomegaly, splenomegaly, mass or pulsatile mass.     Tenderness: There is no abdominal tenderness.  Musculoskeletal:        General: Normal range of motion.     Cervical back: Normal range of motion and neck supple.  Lymphadenopathy:     Cervical: No cervical adenopathy.  Skin:    General: Skin is warm and dry.  Neurological:     Mental Status: She is alert and oriented to  person, place, and time.     Deep Tendon Reflexes: Reflexes are normal and symmetric.  Psychiatric:        Behavior: Behavior normal.        Thought Content: Thought content normal.        Judgment: Judgment normal.    BP 124/90    Pulse 86    Temp 97.8 F (36.6 C) (Temporal)    Resp 20    Ht 5\' 6"  (1.676 m)    Wt 236 lb (107 kg)    SpO2 94%    BMI 38.09 kg/m        Assessment & Plan:  Michelle Blake comes in today with chief complaint of Annual Exam (No pap     Pain and 2 fingers on left hand and toes on left foot)   Diagnosis and orders addressed:  1. Essential hypertension Low sodium diet - losartan (COZAAR) 50 MG tablet; Take 1 tablet (50 mg total) by mouth daily.  Dispense: 90 tablet; Refill: 1 - rosuvastatin (CRESTOR) 10 MG tablet; Take 1 tablet (10 mg total) by mouth daily.  Dispense: 90 tablet; Refill: 1  2. Gastroesophageal reflux disease without esophagitis Avoid spicy foods Do not eat 2 hours prior to bedtime - lansoprazole (PREVACID) 30 MG capsule; Take 1 capsule (30 mg total) by mouth daily at 12 noon.  Dispense: 30 capsule; Refill: 3  3. Current smoker Smoking cessation encouraged  4. Obesity (BMI 30-39.9) Discussed diet and exercise for person with BMI >25 Will recheck weight in 3-6 months   5. Numbness of finger - predniSONE (STERAPRED UNI-PAK 21 TAB) 10 MG (21) TBPK tablet; As directed x 6 days  Dispense: 21 tablet; Refill: 0  6. Metabolic syndrome Watch carb in diet - Semaglutide,0.25 or 0.5MG /DOS, (OZEMPIC, 0.25 OR 0.5 MG/DOSE,) 2 MG/1.5ML SOPN; Inject 0.5 mg into the skin once a week.  Dispense: 1.5 mL; Refill: 3   Labs pending Health Maintenance reviewed- patient will schedule mammogram Diet and exercise encouraged  Follow up plan: 6 months   Mary-Margaret Sheral Apley, FNP

## 2021-09-04 NOTE — Progress Notes (Deleted)
° °  Subjective:    Patient ID: Michelle Blake, female    DOB: [birth year], 50 y.o.   MRN: [id number]  HPI    Review of Systems     Objective:   Physical Exam        Assessment & Plan:

## 2021-09-04 NOTE — Patient Instructions (Signed)
Metabolic Syndrome Metabolic syndrome occurs when you have a combination of three or more factors that increase your chances of developing cardiovascular disease and diabetes. These factors include: High fasting blood sugar (glucose). High blood triglyceride level. High blood pressure. Low levels of high-density lipoprotein (HDL) blood cholesterol. Having a waist measurement that is: More than 40 inches in men. More than 35 inches in women. Metabolic syndrome is sometimes called insulin resistance syndrome or syndromeX. What are the causes? The exact cause of this condition is not known. It may be related to a combination of the factors that were passed down from your parents (genes) and the things that you do, eat, and drink (lifestyle choices). What increases the risk? You are more likely to develop this condition if you: Eat a diet that is high in calories and saturated fat. Do not exercise regularly. Are obese. Have a family history of type 2 diabetes mellitus. Have insulin resistance. Have a history of gestational diabetes during a previous pregnancy. Have conditions such as cardiovascular disease, nonalcoholic fatty liver disease, or polycystic ovary syndrome (PCOS). Are older. The risk increases with age. Use any tobacco products, including cigarettes, chewing tobacco, or e-cigarettes. What are the signs or symptoms? Metabolic syndrome has no specific symptoms. Having abnormal blood test resultsmay be the only sign of metabolic syndrome. How is this diagnosed? This condition may be diagnosed based on: Your blood pressure measurements. Your waist measurement. Blood tests. Your personal and family medical history. How is this treated? Treatment may include: Lifestyle changes to reduce your risk for heart disease, stroke, and diabetes. These include: Exercise. Weight loss. Eating a healthy diet. Stopping tobacco and nicotine use. Medicines that: Help your body maintain  normal blood glucose levels. Lower your blood pressure and your blood triglyceride levels. You may be referred to a behavioral counselor who will help you make lifestylechanges that will lower your risk for serious disease. Follow these instructions at home: Lifestyle     Exercise regularly, as told by your health care provider. Eat a healthy diet that includes fresh fruits and vegetables, whole grains, lean proteins, and low-fat or nonfat dairy products. Do not use any products that contain nicotine or tobacco, such as cigarettes, e-cigarettes, and chewing tobacco. If you need help quitting, ask your health care provider. Maintain a healthy weight. Work with your health care provider to lose weight safely, if needed. General instructions Take over-the-counter and prescription medicines only as told by your health care provider. If directed, measure your waist regularly and write down the measurements. To measure your waist: Stand up straight. Breathe out. Wrap a measuring tape around the part of your waist that is just above your hip bones. Read and write down the measurement. Keep all follow-up visits. This is important. Contact a health care provider if: You feel very tired. You are extremely thirsty. You urinate a lot more than usual. Your waist gets bigger. You have headaches that do not go away. Get help right away if: You suddenly develop any of the following: Dizziness. Blurry vision. Trouble speaking. Trouble swallowing. Weakness in an arm or leg. Chest pain. Trouble breathing. Your heartbeat feels abnormal. You faint. Summary Metabolic syndrome occurs when you have a combination of three or more factors that increase your chances of developing cardiovascular disease and diabetes. These factors include high fasting blood sugar (glucose), high blood triglyceride level, high blood pressure, low levels of high-density lipoprotein (HDL) blood cholesterol, and a waist  measurement that is more than 40   inches in men or more than 35 inches in women. Metabolic syndrome has no specific symptoms. Having abnormal blood test results may be the only signs of metabolic syndrome. Treatment may include lifestyle changes and medicines to reduce your risk for heart disease, stroke, and diabetes. This information is not intended to replace advice given to you by your health care provider. Make sure you discuss any questions you have with your healthcare provider. Document Revised: 01/29/2020 Document Reviewed: 01/29/2020 Elsevier Patient Education  2022 [employer] Inc[employer]  

## 2021-11-05 ENCOUNTER — Other Ambulatory Visit: Payer: Self-pay | Admitting: Nurse Practitioner

## 2021-11-05 DIAGNOSIS — K219 Gastro-esophageal reflux disease without esophagitis: Secondary | ICD-10-CM

## 2021-11-17 ENCOUNTER — Other Ambulatory Visit: Payer: Self-pay | Admitting: Nurse Practitioner

## 2021-11-17 DIAGNOSIS — E8881 Metabolic syndrome: Secondary | ICD-10-CM

## 2022-01-20 ENCOUNTER — Other Ambulatory Visit: Payer: Self-pay | Admitting: Nurse Practitioner

## 2022-01-20 DIAGNOSIS — I10 Essential (primary) hypertension: Secondary | ICD-10-CM

## 2022-01-27 ENCOUNTER — Other Ambulatory Visit: Payer: Self-pay | Admitting: Nurse Practitioner

## 2022-01-27 DIAGNOSIS — I10 Essential (primary) hypertension: Secondary | ICD-10-CM

## 2022-03-06 DIAGNOSIS — R062 Wheezing: Secondary | ICD-10-CM | POA: Diagnosis not present

## 2022-03-06 DIAGNOSIS — J019 Acute sinusitis, unspecified: Secondary | ICD-10-CM | POA: Diagnosis not present

## 2022-03-06 DIAGNOSIS — J069 Acute upper respiratory infection, unspecified: Secondary | ICD-10-CM | POA: Diagnosis not present

## 2022-03-06 DIAGNOSIS — Z6837 Body mass index (BMI) 37.0-37.9, adult: Secondary | ICD-10-CM | POA: Diagnosis not present

## 2022-03-16 ENCOUNTER — Ambulatory Visit (INDEPENDENT_AMBULATORY_CARE_PROVIDER_SITE_OTHER): Payer: BC Managed Care – PPO | Admitting: Nurse Practitioner

## 2022-03-16 ENCOUNTER — Encounter: Payer: Self-pay | Admitting: Nurse Practitioner

## 2022-03-16 VITALS — BP 117/81 | HR 95 | Temp 97.6°F | Resp 20 | Ht 66.0 in | Wt 236.0 lb

## 2022-03-16 DIAGNOSIS — F17200 Nicotine dependence, unspecified, uncomplicated: Secondary | ICD-10-CM | POA: Diagnosis not present

## 2022-03-16 DIAGNOSIS — K219 Gastro-esophageal reflux disease without esophagitis: Secondary | ICD-10-CM

## 2022-03-16 DIAGNOSIS — I10 Essential (primary) hypertension: Secondary | ICD-10-CM

## 2022-03-16 DIAGNOSIS — B009 Herpesviral infection, unspecified: Secondary | ICD-10-CM

## 2022-03-16 DIAGNOSIS — E669 Obesity, unspecified: Secondary | ICD-10-CM

## 2022-03-16 MED ORDER — LANSOPRAZOLE 30 MG PO CPDR: 30.0000 mg | DELAYED_RELEASE_CAPSULE | Freq: Every day | ORAL | 1 refills | Status: DC

## 2022-03-16 MED ORDER — ROSUVASTATIN CALCIUM 10 MG PO TABS: 10.0000 mg | ORAL_TABLET | Freq: Every day | ORAL | 1 refills | Status: DC

## 2022-03-16 MED ORDER — LOSARTAN POTASSIUM 50 MG PO TABS: 50.0000 mg | ORAL_TABLET | Freq: Every day | ORAL | 1 refills | Status: DC

## 2022-03-16 MED ORDER — VALACYCLOVIR HCL 1 G PO TABS: 1000.0000 mg | ORAL_TABLET | Freq: Two times a day (BID) | ORAL | 1 refills | Status: DC

## 2022-03-16 NOTE — Progress Notes (Signed)
Subjective:    Patient ID: Michelle Blake, female    DOB: 01-04-1971, 51 y.o.   MRN: [id number]   Chief Complaint: Medical Management of Chronic Issues    HPI:  Michelle Blake is a 51 y.o. who identifies as a female who was assigned female at birth.   Social history: Lives with: husband Work history: works at [employer] in today for follow up of the following chronic medical issues:  1. Essential hypertension No c/o chest pain, sob or headache, does not check blood pressure at home. BP Readings from Last 3 Encounters:  03/16/22 117/81  09/04/21 124/90  02/18/21 128/87     2. Gastroesophageal reflux disease without esophagitis Is on prevacid daily and is doing well.  3. Herpes simplex type 1 infection Is o valtrex only as needed.  4. Current smoker Smokes 1 pack a day  5. Obesity (BMI 30-39.9) No recent weight changes Wt Readings from Last 3 Encounters:  03/16/22 236 lb (107 kg)  09/04/21 236 lb (107 kg)  02/18/21 234 lb (106.1 kg)   BMI Readings from Last 3 Encounters:  03/16/22 38.09 kg/m  09/04/21 38.09 kg/m  02/18/21 37.77 kg/m      New complaints: None today  Allergies  Allergen Reactions   Ace Inhibitors Cough   Outpatient Encounter Medications as of 03/16/2022  Medication Sig   aspirin EC 81 MG tablet Take 81 mg by mouth daily. Swallow whole.   lansoprazole (PREVACID) 30 MG capsule TAKE 1 CAPSULE BY MOUTH DAILY AT 12 NOON   levonorgestrel (MIRENA) 20 MCG/24HR IUD 1 each by Intrauterine route once.   losartan (COZAAR) 50 MG tablet TAKE 1 TABLET BY MOUTH DAILY   OZEMPIC, 0.25 OR 0.5 MG/DOSE, 2 MG/1.5ML SOPN INJECT SUBCUTANEOUSLY 0.5 MG  EVERY WEEK   rosuvastatin (CRESTOR) 10 MG tablet TAKE 1 TABLET BY MOUTH DAILY   valACYclovir (VALTREX) 1000 MG tablet Take 1 tablet (1,000 mg total) by mouth 2 (two) times daily.   [DISCONTINUED] predniSONE (STERAPRED UNI-PAK 21 TAB) 10 MG (21) TBPK tablet As directed x 6 days   No  facility-administered encounter medications on file as of 03/16/2022.    Past Surgical History:  Procedure Laterality Date   FOOT SURGERY     WISDOM TOOTH EXTRACTION      Family History  Problem Relation Age of Onset   Cancer Father        bladder   Hypertension Father    Parkinson's disease Father    Breast cancer Neg Hx       Controlled substance contract: n/a     Review of Systems  Constitutional:  Negative for diaphoresis.  Eyes:  Negative for pain.  Respiratory:  Negative for shortness of breath.   Cardiovascular:  Negative for chest pain, palpitations and leg swelling.  Gastrointestinal:  Negative for abdominal pain.  Endocrine: Negative for polydipsia.  Skin:  Negative for rash.  Neurological:  Negative for dizziness, weakness and headaches.  Hematological:  Does not bruise/bleed easily.  All other systems reviewed and are negative.      Objective:   Physical Exam Vitals and nursing note reviewed.  Constitutional:      General: She is not in acute distress.    Appearance: Normal appearance. She is well-developed.  HENT:     Head: Normocephalic.     Right Ear: Tympanic membrane normal.     Left Ear: Tympanic membrane normal.     Nose: Nose normal.  Mouth/Throat:     Mouth: Mucous membranes are moist.  Eyes:     Pupils: Pupils are equal, round, and reactive to light.  Neck:     Vascular: No carotid bruit or JVD.  Cardiovascular:     Rate and Rhythm: Normal rate and regular rhythm.     Heart sounds: Normal heart sounds.  Pulmonary:     Effort: Pulmonary effort is normal. No respiratory distress.     Breath sounds: Normal breath sounds. No wheezing or rales.  Chest:     Chest wall: No tenderness.  Abdominal:     General: Bowel sounds are normal. There is no distension or abdominal bruit.     Palpations: Abdomen is soft. There is no hepatomegaly, splenomegaly, mass or pulsatile mass.     Tenderness: There is no abdominal tenderness.   Musculoskeletal:        General: Normal range of motion.     Cervical back: Normal range of motion and neck supple.  Lymphadenopathy:     Cervical: No cervical adenopathy.  Skin:    General: Skin is warm and dry.  Neurological:     Mental Status: She is alert and oriented to person, place, and time.     Deep Tendon Reflexes: Reflexes are normal and symmetric.  Psychiatric:        Behavior: Behavior normal.        Thought Content: Thought content normal.        Judgment: Judgment normal.    BP 117/81   Pulse 95   Temp 97.6 F (36.4 C) (Temporal)   Resp 20   Ht 5\' 6"  (1.676 m)   Wt 236 lb (107 kg)   SpO2 92%   BMI 38.09 kg/m         Assessment & Plan:   Michelle Blake comes in today with chief complaint of Medical Management of Chronic Issues   Diagnosis and orders addressed:  1. Essential hypertension Low sodium diet - losartan (COZAAR) 50 MG tablet; Take 1 tablet (50 mg total) by mouth daily.  Dispense: 90 tablet; Refill: 1 - rosuvastatin (CRESTOR) 10 MG tablet; Take 1 tablet (10 mg total) by mouth daily.  Dispense: 90 tablet; Refill: 1  2. Gastroesophageal reflux disease without esophagitis Avoid spicy foods Do not eat 2 hours prior to bedtime - lansoprazole (PREVACID) 30 MG capsule; Take 1 capsule (30 mg total) by mouth daily at 12 noon.  Dispense: 90 capsule; Refill: 1  3. Herpes simplex type 1 infection - valACYclovir (VALTREX) 1000 MG tablet; Take 1 tablet (1,000 mg total) by mouth 2 (two) times daily.  Dispense: 30 tablet; Refill: 1  4. Current smoker Smoking cessation encouraged  5. Obesity (BMI 30-39.9) Discussed diet and exercise for person with BMI >25 Will recheck weight in 3-6 months  Orders Placed This Encounter  Procedures   CMP14+EGFR   CBC with Differential/Platelet   Lipid panel     Labs pending Health Maintenance reviewed Diet and exercise encouraged  Follow up plan: 6 months   Mary-Margaret Daphine Deutscher, FNP

## 2022-03-17 LAB — CBC WITH DIFFERENTIAL/PLATELET
Basophils Absolute: 0 10*3/uL (ref 0.0–0.2)
Basos: 0 %
EOS (ABSOLUTE): 0.2 10*3/uL (ref 0.0–0.4)
Eos: 2 %
Hematocrit: 43.3 % (ref 34.0–46.6)
Hemoglobin: 14.6 g/dL (ref 11.1–15.9)
Immature Grans (Abs): 0 10*3/uL (ref 0.0–0.1)
Immature Granulocytes: 0 %
Lymphocytes Absolute: 2.3 10*3/uL (ref 0.7–3.1)
Lymphs: 28 %
MCH: 32.6 pg (ref 26.6–33.0)
MCHC: 33.7 g/dL (ref 31.5–35.7)
MCV: 97 fL (ref 79–97)
Monocytes Absolute: 0.5 10*3/uL (ref 0.1–0.9)
Monocytes: 6 %
Neutrophils Absolute: 5.4 10*3/uL (ref 1.4–7.0)
Neutrophils: 64 %
Platelets: 232 10*3/uL (ref 150–450)
RBC: 4.48 x10E6/uL (ref 3.77–5.28)
RDW: 12.7 % (ref 11.7–15.4)
WBC: 8.4 10*3/uL (ref 3.4–10.8)

## 2022-03-17 LAB — CMP14+EGFR
ALT: 15 IU/L (ref 0–32)
AST: 17 IU/L (ref 0–40)
Albumin/Globulin Ratio: 1.6 (ref 1.2–2.2)
Albumin: 3.8 g/dL (ref 3.8–4.8)
Alkaline Phosphatase: 81 IU/L (ref 44–121)
BUN/Creatinine Ratio: 11 (ref 9–23)
BUN: 10 mg/dL (ref 6–24)
Bilirubin Total: 0.3 mg/dL (ref 0.0–1.2)
CO2: 21 mmol/L (ref 20–29)
Calcium: 8.6 mg/dL — ABNORMAL LOW (ref 8.7–10.2)
Chloride: 106 mmol/L (ref 96–106)
Creatinine, Ser: 0.94 mg/dL (ref 0.57–1.00)
Globulin, Total: 2.4 g/dL (ref 1.5–4.5)
Glucose: 110 mg/dL — ABNORMAL HIGH (ref 70–99)
Potassium: 4.6 mmol/L (ref 3.5–5.2)
Sodium: 140 mmol/L (ref 134–144)
Total Protein: 6.2 g/dL (ref 6.0–8.5)
eGFR: 74 mL/min/{1.73_m2} (ref >59)

## 2022-03-17 LAB — LIPID PANEL
Chol/HDL Ratio: 3.5 ratio (ref 0.0–4.4)
Cholesterol, Total: 135 mg/dL (ref 100–199)
HDL: 39 mg/dL — ABNORMAL LOW (ref >39)
LDL Chol Calc (NIH): 75 mg/dL (ref 0–99)
Triglycerides: 117 mg/dL (ref 0–149)
VLDL Cholesterol Cal: 21 mg/dL (ref 5–40)

## 2022-08-22 ENCOUNTER — Ambulatory Visit
Admission: EM | Admit: 2022-08-22 | Discharge: 2022-08-22 | Discharge status: Against Medical Advice | Payer: BC Managed Care – PPO

## 2022-09-17 ENCOUNTER — Ambulatory Visit (INDEPENDENT_AMBULATORY_CARE_PROVIDER_SITE_OTHER): Payer: BC Managed Care – PPO | Admitting: Nurse Practitioner

## 2022-09-17 ENCOUNTER — Encounter: Payer: Self-pay | Admitting: Nurse Practitioner

## 2022-09-17 VITALS — BP 119/81 | HR 88 | Temp 97.4°F | Resp 20 | Ht 66.0 in | Wt 240.0 lb

## 2022-09-17 DIAGNOSIS — E669 Obesity, unspecified: Secondary | ICD-10-CM | POA: Diagnosis not present

## 2022-09-17 DIAGNOSIS — K219 Gastro-esophageal reflux disease without esophagitis: Secondary | ICD-10-CM

## 2022-09-17 DIAGNOSIS — F17200 Nicotine dependence, unspecified, uncomplicated: Secondary | ICD-10-CM

## 2022-09-17 DIAGNOSIS — I10 Essential (primary) hypertension: Secondary | ICD-10-CM

## 2022-09-17 LAB — CBC WITH DIFFERENTIAL/PLATELET
Basophils Absolute: 0 10*3/uL (ref 0.0–0.2)
Basos: 0 %
EOS (ABSOLUTE): 0.2 10*3/uL (ref 0.0–0.4)
Eos: 2 %
Hematocrit: 42.7 % (ref 34.0–46.6)
Hemoglobin: 14.8 g/dL (ref 11.1–15.9)
Immature Grans (Abs): 0 10*3/uL (ref 0.0–0.1)
Immature Granulocytes: 0 %
Lymphocytes Absolute: 2 10*3/uL (ref 0.7–3.1)
Lymphs: 26 %
MCH: 32.3 pg (ref 26.6–33.0)
MCHC: 34.7 g/dL (ref 31.5–35.7)
MCV: 93 fL (ref 79–97)
Monocytes Absolute: 0.4 10*3/uL (ref 0.1–0.9)
Monocytes: 5 %
Neutrophils Absolute: 5.2 10*3/uL (ref 1.4–7.0)
Neutrophils: 67 %
Platelets: 283 10*3/uL (ref 150–450)
RBC: 4.58 x10E6/uL (ref 3.77–5.28)
RDW: 12.9 % (ref 11.7–15.4)
WBC: 7.9 10*3/uL (ref 3.4–10.8)

## 2022-09-17 LAB — CMP14+EGFR
ALT: 15 IU/L (ref 0–32)
AST: 15 IU/L (ref 0–40)
Albumin/Globulin Ratio: 1.9 (ref 1.2–2.2)
Albumin: 4.1 g/dL (ref 3.8–4.9)
Alkaline Phosphatase: 93 IU/L (ref 44–121)
BUN/Creatinine Ratio: 8 — ABNORMAL LOW (ref 9–23)
BUN: 9 mg/dL (ref 6–24)
Bilirubin Total: 0.2 mg/dL (ref 0.0–1.2)
CO2: 21 mmol/L (ref 20–29)
Calcium: 8.8 mg/dL (ref 8.7–10.2)
Chloride: 106 mmol/L (ref 96–106)
Creatinine, Ser: 1.14 mg/dL — ABNORMAL HIGH (ref 0.57–1.00)
Globulin, Total: 2.2 g/dL (ref 1.5–4.5)
Glucose: 114 mg/dL — ABNORMAL HIGH (ref 70–99)
Potassium: 4.3 mmol/L (ref 3.5–5.2)
Sodium: 141 mmol/L (ref 134–144)
Total Protein: 6.3 g/dL (ref 6.0–8.5)
eGFR: 58 mL/min/{1.73_m2} — ABNORMAL LOW (ref >59)

## 2022-09-17 LAB — LIPID PANEL
Chol/HDL Ratio: 3.6 ratio (ref 0.0–4.4)
Cholesterol, Total: 128 mg/dL (ref 100–199)
HDL: 36 mg/dL — ABNORMAL LOW (ref >39)
LDL Chol Calc (NIH): 66 mg/dL (ref 0–99)
Triglycerides: 147 mg/dL (ref 0–149)
VLDL Cholesterol Cal: 26 mg/dL (ref 5–40)

## 2022-09-17 MED ORDER — ROSUVASTATIN CALCIUM 10 MG PO TABS: 10.0000 mg | ORAL_TABLET | Freq: Every day | ORAL | 1 refills | Status: DC

## 2022-09-17 MED ORDER — LOSARTAN POTASSIUM 50 MG PO TABS: 50.0000 mg | ORAL_TABLET | Freq: Every day | ORAL | 1 refills | Status: DC

## 2022-09-17 MED ORDER — LANSOPRAZOLE 30 MG PO CPDR: 30.0000 mg | DELAYED_RELEASE_CAPSULE | Freq: Every day | ORAL | 1 refills | Status: DC

## 2022-09-17 NOTE — Patient Instructions (Signed)

## 2022-09-17 NOTE — Progress Notes (Signed)
Subjective:    Patient ID: Michelle Blake, female    DOB: 12/10/1970, 51 y.o.   MRN: [id number]   Chief Complaint: medical management of chronic issues     HPI:  Michelle Blake is a 51 y.o. who identifies as a female who was assigned female at birth.   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 Essential hypertension No c/o chest pain, sob or headache. Does not check blood pressure at home. BP Readings from Last 3 Encounters:  03/16/22 117/81  09/04/21 124/90  02/18/21 128/87     2. Gastroesophageal reflux disease without esophagitis Is on prevacid daily  3. Current smoker Smokes over a pack a day.   4. Obesity (BMI 30-39.9) Weight is up 4 lbs Wt Readings from Last 3 Encounters:  09/17/22 240 lb (108.9 kg)  03/16/22 236 lb (107 kg)  09/04/21 236 lb (107 kg)   BMI Readings from Last 3 Encounters:  09/17/22 38.74 kg/m  03/16/22 38.09 kg/m  09/04/21 38.09 kg/m     New complaints: N/a  Allergies  Allergen Reactions   Ace Inhibitors Cough   Outpatient Encounter Medications as of 09/17/2022  Medication Sig   aspirin EC 81 MG tablet Take 81 mg by mouth daily. Swallow whole.   lansoprazole (PREVACID) 30 MG capsule Take 1 capsule (30 mg total) by mouth daily at 12 noon.   levonorgestrel (MIRENA) 20 MCG/24HR IUD 1 each by Intrauterine route once.   losartan (COZAAR) 50 MG tablet Take 1 tablet (50 mg total) by mouth daily.   OZEMPIC, 0.25 OR 0.5 MG/DOSE, 2 MG/1.5ML SOPN INJECT SUBCUTANEOUSLY 0.5 MG  EVERY WEEK   rosuvastatin (CRESTOR) 10 MG tablet Take 1 tablet (10 mg total) by mouth daily.   valACYclovir (VALTREX) 1000 MG tablet Take 1 tablet (1,000 mg total) by mouth 2 (two) times daily.   No facility-administered encounter medications on file as of 09/17/2022.    Past Surgical History:  Procedure Laterality Date   FOOT SURGERY      WISDOM TOOTH EXTRACTION      Family History  Problem Relation Age of Onset   Cancer Father        bladder   Hypertension Father    Parkinson's disease Father    Breast cancer Neg Hx       Controlled substance contract: n/a     Review of Systems  Constitutional:  Negative for diaphoresis.  Eyes:  Negative for pain.  Respiratory:  Negative for shortness of breath.   Cardiovascular:  Negative for chest pain, palpitations and leg swelling.  Gastrointestinal:  Negative for abdominal pain.  Endocrine: Negative for polydipsia.  Skin:  Negative for rash.  Neurological:  Negative for dizziness, weakness and headaches.  Hematological:  Does not bruise/bleed easily.  All other systems reviewed and are negative.      Objective:   Physical Exam Vitals and nursing note reviewed.  Constitutional:      General: She is not in acute distress.    Appearance: Normal appearance. She is well-developed.  HENT:     Head: Normocephalic.     Right Ear: Tympanic membrane normal.     Left Ear: Tympanic membrane normal.     Nose: Nose normal.     Mouth/Throat:     Mouth: Mucous membranes are moist.  Eyes:     Pupils: Pupils are  equal, round, and reactive to light.  Neck:     Vascular: No carotid bruit or JVD.  Cardiovascular:     Rate and Rhythm: Normal rate and regular rhythm.     Heart sounds: Normal heart sounds.  Pulmonary:     Effort: Pulmonary effort is normal. No respiratory distress.     Breath sounds: Normal breath sounds. No wheezing or rales.  Chest:     Chest wall: No tenderness.  Abdominal:     General: Bowel sounds are normal. There is no distension or abdominal bruit.     Palpations: Abdomen is soft. There is no hepatomegaly, splenomegaly, mass or pulsatile mass.     Tenderness: There is no abdominal tenderness.  Musculoskeletal:        General: Normal range of motion.     Cervical back: Normal range of motion and neck supple.  Lymphadenopathy:     Cervical: No  cervical adenopathy.  Skin:    General: Skin is warm and dry.  Neurological:     Mental Status: She is alert and oriented to person, place, and time.     Deep Tendon Reflexes: Reflexes are normal and symmetric.  Psychiatric:        Behavior: Behavior normal.        Thought Content: Thought content normal.        Judgment: Judgment normal.     BP 119/81   Pulse 88   Temp (!) 97.4 F (36.3 C) (Temporal)   Resp 20   Ht _0  (1.676 m)   Wt 240 lb (108.9 kg)   SpO2 91%   BMI 38.74 kg/m        Assessment & Plan:   Michelle Blake comes in today with chief complaint of Medical Management of Chronic Issues   Diagnosis and orders addressed:  1. Essential hypertension Low sodium diet - CBC with Differential/Platelet - CMP14+EGFR - Lipid panel - losartan (COZAAR) 50 MG tablet; Take 1 tablet (50 mg total) by mouth daily.  Dispense: 90 tablet; Refill: 1 - rosuvastatin (CRESTOR) 10 MG tablet; Take 1 tablet (10 mg total) by mouth daily.  Dispense: 90 tablet; Refill: 1  2. Gastroesophageal reflux disease without esophagitis Avoid spicy foods Do not eat 2 hours prior to bedtime  - lansoprazole (PREVACID) 30 MG capsule; Take 1 capsule (30 mg total) by mouth daily at 12 noon.  Dispense: 90 capsule; Refill: 1  3. Current smoker Smoking cessation  4. Obesity (BMI 30-39.9) Discussed diet and exercise for person with BMI >25 Will recheck weight in 3-6 months    Labs pending Health Maintenance reviewed Diet and exercise encouraged  Follow up plan: [place], FNP

## 2022-11-25 ENCOUNTER — Encounter: Payer: Self-pay | Admitting: *Deleted

## 2023-03-04 ENCOUNTER — Encounter: Payer: Self-pay | Admitting: Nurse Practitioner

## 2023-03-04 ENCOUNTER — Ambulatory Visit (INDEPENDENT_AMBULATORY_CARE_PROVIDER_SITE_OTHER): Payer: No Typology Code available for payment source | Admitting: Nurse Practitioner

## 2023-03-04 VITALS — BP 142/102 | HR 100 | Temp 97.9°F | Resp 20 | Ht 66.0 in | Wt 237.0 lb

## 2023-03-04 DIAGNOSIS — I10 Essential (primary) hypertension: Secondary | ICD-10-CM | POA: Diagnosis not present

## 2023-03-04 DIAGNOSIS — F419 Anxiety disorder, unspecified: Secondary | ICD-10-CM

## 2023-03-04 MED ORDER — BUSPIRONE HCL 10 MG PO TABS: 10.0000 mg | ORAL_TABLET | Freq: Two times a day (BID) | ORAL | 2 refills | Status: DC

## 2023-03-04 MED ORDER — LOSARTAN POTASSIUM 100 MG PO TABS: 100.0000 mg | ORAL_TABLET | Freq: Every day | ORAL | 1 refills | Status: DC

## 2023-03-04 NOTE — Patient Instructions (Signed)

## 2023-03-04 NOTE — Progress Notes (Signed)
   Subjective:    Patient ID: Michelle Blake, female    DOB: 1971/01/16, 52 y.o.   MRN: [id number]   Chief Complaint: Nausea and Anxiety   Patient come in today c/o nausea due to anxiety. Her om had 2 strokes and is going to hospice. Patient is very close to her mom and is very upset.       03/04/2023   11:39 AM 09/17/2022    9:32 AM 03/16/2022    8:53 AM 09/04/2021    9:46 AM  GAD 7 : Generalized Anxiety Score  Nervous, Anxious, on Edge 3 0 0 0  Control/stop worrying 3 0 0 0  Worry too much - different things 3 0 0 0  Trouble relaxing 2 0 0 2  Restless 2 0 0 2  Easily annoyed or irritable 2 0 0 1  Afraid - awful might happen 2 0 0 0  Total GAD 7 Score 17 0 0 5  Anxiety Difficulty Somewhat difficult Not difficult at all Not difficult at all Not difficult at all      Patient Active Problem List   Diagnosis Date Noted   Gastroesophageal reflux disease without esophagitis 09/17/2020   Herpes simplex type 1 infection 06/28/2018   Essential hypertension 01/30/2016   Obesity (BMI 30-39.9) 01/30/2016   Current smoker 01/30/2016       Review of Systems  Constitutional:  Negative for diaphoresis.  Eyes:  Negative for pain.  Respiratory:  Negative for shortness of breath.   Cardiovascular:  Negative for chest pain, palpitations and leg swelling.  Gastrointestinal:  Negative for abdominal pain.  Endocrine: Negative for polydipsia.  Skin:  Negative for rash.  Neurological:  Negative for dizziness, weakness and headaches.  Hematological:  Does not bruise/bleed easily.  All other systems reviewed and are negative.      Objective:   Physical Exam Constitutional:      Appearance: Normal appearance.  Cardiovascular:     Rate and Rhythm: Normal rate and regular rhythm.     Heart sounds: Normal heart sounds.  Pulmonary:     Effort: Pulmonary effort is normal.     Breath sounds: Normal breath sounds.  Skin:    General: Skin is warm.  Neurological:     General: No  focal deficit present.     Mental Status: She is alert and oriented to person, place, and time.  Psychiatric:        Mood and Affect: Mood normal.        Behavior: Behavior normal.    BP (!) 142/102   Pulse 100   Temp 97.9 F (36.6 C) (Temporal)   Resp 20   Ht 5\' 6"  (1.676 m)   Wt 237 lb (107.5 kg)   SpO2 93%   BMI 38.25 kg/m         Assessment & Plan:   Michelle Blake comes in today with chief complaint of Nausea and Anxiety   Diagnosis and orders addressed:  1. Essential hypertension Low sodium diet - losartan (COZAAR) 100 MG tablet; Take 1 tablet (100 mg total) by mouth daily.  Dispense: 90 tablet; Refill: 1  2. Anxiety Stress management - busPIRone (BUSPAR) 10 MG tablet; Take 1 tablet (10 mg total) by mouth 2 (two) times daily.  Dispense: 60 tablet; Refill: 2    Follow up plan: prn   Mary-Margaret Daphine Deutscher, FNP

## 2023-03-05 ENCOUNTER — Telehealth: Payer: Self-pay | Admitting: Nurse Practitioner

## 2023-03-05 DIAGNOSIS — Z0279 Encounter for issue of other medical certificate: Secondary | ICD-10-CM

## 2023-03-05 NOTE — Telephone Encounter (Signed)
[facility] faxed STD forms to be completed and signed.  Form Fee Paid? (Y/N)       yes     If YES, then form will be placed in the RX/HH Nurse Coordinators box for completion.

## 2023-03-05 NOTE — Telephone Encounter (Signed)
Information completed and forwarded to PCP 

## 2023-03-16 NOTE — Telephone Encounter (Signed)
PCP completed and signed forms. They have been faxed to [facility] at fax number [phone]. Patient has been contacted and informed they are complete. Copy of forms placed up front for pt.

## 2023-03-23 ENCOUNTER — Ambulatory Visit: Payer: BC Managed Care – PPO | Admitting: Nurse Practitioner

## 2023-03-26 DIAGNOSIS — Z0279 Encounter for issue of other medical certificate: Secondary | ICD-10-CM

## 2023-04-06 NOTE — Telephone Encounter (Signed)
Forms not received by [facility] financial, refaxed these, as well as to pt at [phone].

## 2023-04-23 ENCOUNTER — Ambulatory Visit: Payer: BC Managed Care – PPO | Admitting: Nurse Practitioner

## 2023-04-27 ENCOUNTER — Ambulatory Visit: Payer: BC Managed Care – PPO | Admitting: Nurse Practitioner

## 2023-05-03 ENCOUNTER — Other Ambulatory Visit: Payer: Self-pay

## 2023-05-03 DIAGNOSIS — Z1231 Encounter for screening mammogram for malignant neoplasm of breast: Secondary | ICD-10-CM

## 2023-05-25 NOTE — Patient Instructions (Signed)

## 2023-05-28 ENCOUNTER — Ambulatory Visit (INDEPENDENT_AMBULATORY_CARE_PROVIDER_SITE_OTHER): Payer: No Typology Code available for payment source | Admitting: Nurse Practitioner

## 2023-05-28 ENCOUNTER — Other Ambulatory Visit (HOSPITAL_COMMUNITY)
Admission: RE | Admit: 2023-05-28 | Discharge: 2023-05-28 | Disposition: A | Discharge status: Home / Self Care | Payer: No Typology Code available for payment source | Source: Ambulatory Visit | Attending: Nurse Practitioner | Admitting: Nurse Practitioner

## 2023-05-28 ENCOUNTER — Encounter: Payer: Self-pay | Admitting: Nurse Practitioner

## 2023-05-28 VITALS — BP 109/80 | HR 83 | Temp 97.7°F | Resp 20 | Ht 66.0 in | Wt 239.0 lb

## 2023-05-28 DIAGNOSIS — E669 Obesity, unspecified: Secondary | ICD-10-CM

## 2023-05-28 DIAGNOSIS — F419 Anxiety disorder, unspecified: Secondary | ICD-10-CM

## 2023-05-28 DIAGNOSIS — Z0000 Encounter for general adult medical examination without abnormal findings: Secondary | ICD-10-CM | POA: Diagnosis present

## 2023-05-28 DIAGNOSIS — Z0001 Encounter for general adult medical examination with abnormal findings: Secondary | ICD-10-CM

## 2023-05-28 DIAGNOSIS — F17200 Nicotine dependence, unspecified, uncomplicated: Secondary | ICD-10-CM | POA: Diagnosis not present

## 2023-05-28 DIAGNOSIS — K219 Gastro-esophageal reflux disease without esophagitis: Secondary | ICD-10-CM

## 2023-05-28 DIAGNOSIS — I10 Essential (primary) hypertension: Secondary | ICD-10-CM | POA: Diagnosis not present

## 2023-05-28 MED ORDER — LOSARTAN POTASSIUM 100 MG PO TABS: 100.0000 mg | ORAL_TABLET | Freq: Every day | ORAL | 1 refills | Status: DC

## 2023-05-28 MED ORDER — BUSPIRONE HCL 10 MG PO TABS: 10.0000 mg | ORAL_TABLET | Freq: Two times a day (BID) | ORAL | 5 refills | Status: DC

## 2023-05-28 MED ORDER — LANSOPRAZOLE 30 MG PO CPDR: 30.0000 mg | DELAYED_RELEASE_CAPSULE | Freq: Every day | ORAL | 1 refills | Status: DC

## 2023-05-28 MED ORDER — ROSUVASTATIN CALCIUM 10 MG PO TABS: 10.0000 mg | ORAL_TABLET | Freq: Every day | ORAL | 1 refills | Status: DC

## 2023-05-28 NOTE — Progress Notes (Signed)
Subjective:    Patient ID: [name], female    DOB: 1971-05-21, 52 y.o.   MRN: [id number]   Chief Complaint: annual physical   HPI:  Michelle Blake is a 52 y.o. who identifies as a female who was assigned female at birth.   Social history: Lives with: husband Work history: bank   Comes in today for follow up of the following chronic medical issues:  1. Annual physical exam Here for pap and mirena change today.  2. Essential hypertension No c/o chest pain, sob or headache. Does not check blood pressure at homme. BP Readings from Last 3 Encounters:  03/04/23 (!) 142/102  09/17/22 119/81  03/16/22 117/81    3. Gastroesophageal reflux disease without esophagitis Is on prevacid and is doing well.  4. Current smoker Smokes over a pack a day  5. Obesity (BMI 30-39.9) No recent weight changes Wt Readings from Last 3 Encounters:  05/28/23 239 lb (108.4 kg)  03/04/23 237 lb (107.5 kg)  09/17/22 240 lb (108.9 kg)   BMI Readings from Last 3 Encounters:  05/28/23 38.58 kg/m  03/04/23 38.25 kg/m  09/17/22 38.74 kg/m      New complaints: none  Allergies  Allergen Reactions   Ace Inhibitors Cough   Outpatient Encounter Medications as of 05/28/2023  Medication Sig   aspirin EC 81 MG tablet Take 81 mg by mouth daily. Swallow whole.   busPIRone (BUSPAR) 10 MG tablet Take 1 tablet (10 mg total) by mouth 2 (two) times daily.   lansoprazole (PREVACID) 30 MG capsule Take 1 capsule (30 mg total) by mouth daily at 12 noon.   levonorgestrel (MIRENA) 20 MCG/24HR IUD 1 each by Intrauterine route once.   losartan (COZAAR) 100 MG tablet Take 1 tablet (100 mg total) by mouth daily.   rosuvastatin (CRESTOR) 10 MG tablet Take 1 tablet (10 mg total) by mouth daily.   valACYclovir (VALTREX) 1000 MG tablet Take 1 tablet (1,000 mg total) by mouth 2 (two) times daily.   No facility-administered encounter medications on file as of 05/28/2023.    Past Surgical History:   Procedure Laterality Date   FOOT SURGERY     WISDOM TOOTH EXTRACTION      Family History  Problem Relation Age of Onset   Cancer Father        bladder   Hypertension Father    Parkinson's disease Father    Breast cancer Neg Hx       Controlled substance contract: n/a     Review of Systems  Constitutional:  Negative for diaphoresis.  Eyes:  Negative for pain.  Respiratory:  Negative for shortness of breath.   Cardiovascular:  Negative for chest pain, palpitations and leg swelling.  Gastrointestinal:  Negative for abdominal pain.  Endocrine: Negative for polydipsia.  Skin:  Negative for rash.  Neurological:  Negative for dizziness, weakness and headaches.  Hematological:  Does not bruise/bleed easily.  All other systems reviewed and are negative.      Objective:   Physical Exam Vitals and nursing note reviewed.  Constitutional:      General: She is not in acute distress.    Appearance: Normal appearance. She is well-developed.  HENT:     Head: Normocephalic.     Right Ear: Tympanic membrane normal.     Left Ear: Tympanic membrane normal.     Nose: Nose normal.     Mouth/Throat:     Mouth: Mucous membranes are moist.  Eyes:  Pupils: Pupils are equal, round, and reactive to light.  Neck:     Vascular: No carotid bruit or JVD.  Cardiovascular:     Rate and Rhythm: Normal rate and regular rhythm.     Heart sounds: Normal heart sounds.  Pulmonary:     Effort: Pulmonary effort is normal. No respiratory distress.     Breath sounds: Normal breath sounds. No wheezing or rales.  Chest:     Chest wall: No tenderness.  Abdominal:     General: Bowel sounds are normal. There is no distension or abdominal bruit.     Palpations: Abdomen is soft. There is no hepatomegaly, splenomegaly, mass or pulsatile mass.     Tenderness: There is no abdominal tenderness.  Genitourinary:    General: Normal vulva.     Vagina: No vaginal discharge.     Rectum: Normal.      Comments: Cervix parous and pink No adnexal mass or tenderness Musculoskeletal:        General: Normal range of motion.     Cervical back: Normal range of motion and neck supple.  Lymphadenopathy:     Cervical: No cervical adenopathy.  Skin:    General: Skin is warm and dry.  Neurological:     Mental Status: She is alert and oriented to person, place, and time.     Deep Tendon Reflexes: Reflexes are normal and symmetric.  Psychiatric:        Behavior: Behavior normal.        Thought Content: Thought content normal.        Judgment: Judgment normal.     BP 109/80   Pulse 83   Temp 97.7 F (36.5 C) (Temporal)   Resp 20   Ht 5\' 6"  (1.676 m)   Wt 239 lb (108.4 kg)   SpO2 93%   BMI 38.58 kg/m        Assessment & Plan:   Michelle Blake comes in today with chief complaint of Annual Exam   Diagnosis and orders addressed:  1. Annual physical exam Mirena due to be changed 3/26 - CBC with Differential/Platelet - Lipid panel - Thyroid Panel With TSH - VITAMIN D 25 Hydroxy (Vit-D Deficiency, Fractures) - Cytology - PAP  2. Essential hypertension Low sodium diet - losartan (COZAAR) 100 MG tablet; Take 1 tablet (100 mg total) by mouth daily.  Dispense: 90 tablet; Refill: 1 - rosuvastatin (CRESTOR) 10 MG tablet; Take 1 tablet (10 mg total) by mouth daily.  Dispense: 90 tablet; Refill: 1 - CMP14+EGFR  3. Gastroesophageal reflux disease without esophagitis Avoid spicy foods Do not eat 2 hours prior to bedtime  - lansoprazole (PREVACID) 30 MG capsule; Take 1 capsule (30 mg total) by mouth daily at 12 noon.  Dispense: 90 capsule; Refill: 1  4. Current smoker Smoking cessation encouraged  5. Obesity (BMI 30-39.9) Discussed diet and exercise for person with BMI >25 Will recheck weight in 3-6 months   6. Anxiety Stress management - busPIRone (BUSPAR) 10 MG tablet; Take 1 tablet (10 mg total) by mouth 2 (two) times daily.  Dispense: 60 tablet; Refill: 5   Labs  pending Health Maintenance reviewed Diet and exercise encouraged  Follow up plan: 6 months   Michelle Daphine Deutscher, FNP

## 2023-05-29 LAB — CBC WITH DIFFERENTIAL/PLATELET
Basophils Absolute: 0 10*3/uL (ref 0.0–0.2)
Basos: 0 %
EOS (ABSOLUTE): 0.2 10*3/uL (ref 0.0–0.4)
Eos: 2 %
Hematocrit: 42.7 % (ref 34.0–46.6)
Hemoglobin: 14.2 g/dL (ref 11.1–15.9)
Immature Grans (Abs): 0 10*3/uL (ref 0.0–0.1)
Immature Granulocytes: 0 %
Lymphocytes Absolute: 2.5 10*3/uL (ref 0.7–3.1)
Lymphs: 32 %
MCH: 32.5 pg (ref 26.6–33.0)
MCHC: 33.3 g/dL (ref 31.5–35.7)
MCV: 98 fL — ABNORMAL HIGH (ref 79–97)
Monocytes Absolute: 0.5 10*3/uL (ref 0.1–0.9)
Monocytes: 6 %
Neutrophils Absolute: 4.5 10*3/uL (ref 1.4–7.0)
Neutrophils: 60 %
Platelets: 274 10*3/uL (ref 150–450)
RBC: 4.37 x10E6/uL (ref 3.77–5.28)
RDW: 13 % (ref 11.7–15.4)
WBC: 7.7 10*3/uL (ref 3.4–10.8)

## 2023-05-29 LAB — THYROID PANEL WITH TSH
Free Thyroxine Index: 2.1 (ref 1.2–4.9)
T3 Uptake Ratio: 27 % (ref 24–39)
T4, Total: 7.8 ug/dL (ref 4.5–12.0)
TSH: 0.759 u[IU]/mL (ref 0.450–4.500)

## 2023-05-29 LAB — LIPID PANEL
Chol/HDL Ratio: 3.5 ratio (ref 0.0–4.4)
Cholesterol, Total: 130 mg/dL (ref 100–199)
HDL: 37 mg/dL — ABNORMAL LOW (ref >39)
LDL Chol Calc (NIH): 74 mg/dL (ref 0–99)
Triglycerides: 102 mg/dL (ref 0–149)
VLDL Cholesterol Cal: 19 mg/dL (ref 5–40)

## 2023-05-29 LAB — CMP14+EGFR
ALT: 18 IU/L (ref 0–32)
AST: 16 IU/L (ref 0–40)
Albumin: 4.1 g/dL (ref 3.8–4.9)
Alkaline Phosphatase: 88 IU/L (ref 44–121)
BUN/Creatinine Ratio: 13 (ref 9–23)
BUN: 13 mg/dL (ref 6–24)
Bilirubin Total: 0.3 mg/dL (ref 0.0–1.2)
CO2: 23 mmol/L (ref 20–29)
Calcium: 9.1 mg/dL (ref 8.7–10.2)
Chloride: 106 mmol/L (ref 96–106)
Creatinine, Ser: 1.03 mg/dL — ABNORMAL HIGH (ref 0.57–1.00)
Globulin, Total: 2.3 g/dL (ref 1.5–4.5)
Glucose: 97 mg/dL (ref 70–99)
Potassium: 4.9 mmol/L (ref 3.5–5.2)
Sodium: 142 mmol/L (ref 134–144)
Total Protein: 6.4 g/dL (ref 6.0–8.5)
eGFR: 65 mL/min/{1.73_m2} (ref >59)

## 2023-05-29 LAB — VITAMIN D 25 HYDROXY (VIT D DEFICIENCY, FRACTURES): Vit D, 25-Hydroxy: 25.7 ng/mL — ABNORMAL LOW (ref 30.0–100.0)

## 2023-06-02 LAB — CYTOLOGY - PAP
Comment: NEGATIVE
Diagnosis: NEGATIVE
High risk HPV: NEGATIVE

## 2023-07-06 ENCOUNTER — Encounter: Payer: Self-pay | Admitting: Nurse Practitioner

## 2023-07-16 ENCOUNTER — Ambulatory Visit
Admission: RE | Admit: 2023-07-16 | Discharge: 2023-07-16 | Disposition: A | Discharge status: Home / Self Care | Payer: No Typology Code available for payment source | Source: Ambulatory Visit | Attending: Nurse Practitioner | Admitting: Nurse Practitioner

## 2023-07-16 DIAGNOSIS — Z1231 Encounter for screening mammogram for malignant neoplasm of breast: Secondary | ICD-10-CM

## 2023-08-12 ENCOUNTER — Telehealth: Payer: No Typology Code available for payment source | Admitting: Emergency Medicine

## 2023-08-12 DIAGNOSIS — R300 Dysuria: Secondary | ICD-10-CM

## 2023-08-12 MED ORDER — CEPHALEXIN 500 MG PO CAPS: 500.0000 mg | ORAL_CAPSULE | Freq: Two times a day (BID) | ORAL | 0 refills | Status: DC

## 2023-08-12 NOTE — Progress Notes (Signed)
E-Visit for Urinary Problems  We are sorry that you are not feeling well.  Here is how we plan to help!  Based on what you shared with me it looks like you most likely have a simple urinary tract infection.  A UTI (Urinary Tract Infection) is a bacterial infection of the bladder.  Most cases of urinary tract infections are simple to treat but a key part of your care is to encourage you to drink plenty of fluids and watch your symptoms carefully.  I have prescribed Keflex 500 mg twice a day for 7 days.  Your symptoms should gradually improve. Call us if the burning in your urine worsens, you develop worsening fever, back pain or pelvic pain or if your symptoms do not resolve after completing the antibiotic.  Urinary tract infections can be prevented by drinking plenty of water to keep your body hydrated.  Also be sure when you wipe, wipe from front to back and don't hold it in!  If possible, empty your bladder every 4 hours.  HOME CARE Drink plenty of fluids Compete the full course of the antibiotics even if the symptoms resolve Remember, when you need to go.go. Holding in your urine can increase the likelihood of getting a UTI! GET HELP RIGHT AWAY IF: You cannot urinate You get a high fever Worsening back pain occurs You see blood in your urine You feel sick to your stomach or throw up You feel like you are going to pass out  MAKE SURE YOU  Understand these instructions. Will watch your condition. Will get help right away if you are not doing well or get worse.   Thank you for choosing an e-visit.  Your e-visit answers were reviewed by a board certified advanced clinical practitioner to complete your personal care plan. Depending upon the condition, your plan could have included both over the counter or prescription medications.  Please review your pharmacy choice. Make sure the pharmacy is open so you can pick up prescription now. If there is a problem, you may contact your  provider through MyChart messaging and have the prescription routed to another pharmacy.  Your safety is important to us. If you have drug allergies check your prescription carefully.   For the next 24 hours you can use MyChart to ask questions about today's visit, request a non-urgent call back, or ask for a work or school excuse. You will get an email in the next two days asking about your experience. I hope that your e-visit has been valuable and will speed your recovery.  Approximately 5 minutes was used in reviewing the patient's chart, questionnaire, prescribing medications, and documentation.  

## 2023-11-23 ENCOUNTER — Ambulatory Visit: Payer: No Typology Code available for payment source | Admitting: Nurse Practitioner

## 2023-11-23 VITALS — BP 111/84 | HR 91 | Temp 97.7°F | Ht 66.0 in | Wt 248.0 lb

## 2023-11-23 DIAGNOSIS — M5432 Sciatica, left side: Secondary | ICD-10-CM

## 2023-11-23 DIAGNOSIS — K219 Gastro-esophageal reflux disease without esophagitis: Secondary | ICD-10-CM | POA: Diagnosis not present

## 2023-11-23 DIAGNOSIS — I10 Essential (primary) hypertension: Secondary | ICD-10-CM | POA: Diagnosis not present

## 2023-11-23 DIAGNOSIS — M5431 Sciatica, right side: Secondary | ICD-10-CM

## 2023-11-23 DIAGNOSIS — F17200 Nicotine dependence, unspecified, uncomplicated: Secondary | ICD-10-CM

## 2023-11-23 DIAGNOSIS — N951 Menopausal and female climacteric states: Secondary | ICD-10-CM

## 2023-11-23 DIAGNOSIS — Z23 Encounter for immunization: Secondary | ICD-10-CM | POA: Diagnosis not present

## 2023-11-23 DIAGNOSIS — Z6841 Body Mass Index (BMI) 40.0 and over, adult: Secondary | ICD-10-CM

## 2023-11-23 DIAGNOSIS — B009 Herpesviral infection, unspecified: Secondary | ICD-10-CM

## 2023-11-23 DIAGNOSIS — E669 Obesity, unspecified: Secondary | ICD-10-CM

## 2023-11-23 MED ORDER — KETOROLAC TROMETHAMINE 60 MG/2ML IM SOLN
60.0000 mg | Freq: Once | INTRAMUSCULAR | Status: AC
Start: 2023-11-23 — End: 2023-11-23
  Administered 2023-11-23: 60 mg via INTRAMUSCULAR

## 2023-11-23 MED ORDER — ROSUVASTATIN CALCIUM 10 MG PO TABS: 10.0000 mg | ORAL_TABLET | Freq: Every day | ORAL | 1 refills | Status: DC

## 2023-11-23 MED ORDER — LANSOPRAZOLE 30 MG PO CPDR: 30.0000 mg | DELAYED_RELEASE_CAPSULE | Freq: Every day | ORAL | 1 refills | Status: DC

## 2023-11-23 MED ORDER — METHYLPREDNISOLONE ACETATE 80 MG/ML IJ SUSP
80.0000 mg | Freq: Once | INTRAMUSCULAR | Status: AC
Start: 2023-11-23 — End: 2023-11-23
  Administered 2023-11-23: 80 mg via INTRAMUSCULAR

## 2023-11-23 MED ORDER — LOSARTAN POTASSIUM 100 MG PO TABS: 100.0000 mg | ORAL_TABLET | Freq: Every day | ORAL | 1 refills | Status: DC

## 2023-11-23 MED ORDER — VALACYCLOVIR HCL 1 G PO TABS
1000.0000 mg | ORAL_TABLET | Freq: Two times a day (BID) | ORAL | 1 refills | Status: AC
Start: 2023-11-23 — End: ?

## 2023-11-23 NOTE — Patient Instructions (Signed)
 Sciatica  Sciatica is pain, weakness, tingling, or loss of feeling (numbness) along the sciatic nerve. The sciatic nerve starts in the lower back and goes down the back of each leg. Sciatica usually affects one side of the body. Sciatica usually goes away on its own or with treatment. Sometimes, sciatica may come back. What are the causes? This condition happens when the sciatic nerve is pinched or has pressure put on it. This may be caused by: A disk in between the bones of the spine bulging out too far (herniated disk). Changes in the spinal disks due to aging. A condition that affects a muscle in the butt. Extra bone growth near the sciatic nerve. A break (fracture) of the area between your hip bones (pelvis). Pregnancy. Tumor. This is rare. What increases the risk? You are more likely to develop this condition if you: Play sports that put pressure or stress on the spine. Have poor strength and ease of movement (flexibility). Have had a back injury or back surgery. Sit for long periods of time. Do activities that involve bending or lifting over and over again. Are very overweight (obese). What are the signs or symptoms? Symptoms can vary from mild to very bad. They may include: Any of these problems in the lower back, leg, hip, or butt: Mild tingling, loss of feeling, or dull aches. A burning feeling. Sharp pains. Loss of feeling in the back of the calf or the sole of the foot. Leg weakness. Very bad back pain that makes it hard to move. These symptoms may get worse when you cough, sneeze, or laugh. They may also get worse when you sit or stand for long periods of time. How is this treated? This condition often gets better without any treatment. However, treatment may include: Changing or cutting back on physical activity when you have pain. Exercising, including strengthening and stretching. Putting ice or heat on the affected area. Shots of medicines to relieve pain and  swelling or to relax your muscles. Surgery. Follow these instructions at home: Medicines Take over-the-counter and prescription medicines only as told by your doctor. Ask your doctor if you should avoid driving or using machines while you are taking your medicine. Managing pain     If told, put ice on the affected area. To do this: Put ice in a plastic bag. Place a towel between your skin and the bag. Leave the ice on for 20 minutes, 2-3 times a day. If your skin turns bright red, take off the ice right away to prevent skin damage. The risk of skin damage is higher if you cannot feel pain, heat, or cold. If told, put heat on the affected area. Do this as often as told by your doctor. Use the heat source that your doctor tells you to use, such as a moist heat pack or a heating pad. Place a towel between your skin and the heat source. Leave the heat on for 20-30 minutes. If your skin turns bright red, take off the heat right away to prevent burns. The risk of burns is higher if you cannot feel pain, heat, or cold. Activity  Return to your normal activities when your doctor says that it is safe. Avoid activities that make your symptoms worse. Take short rests during the day. When you rest for a long time, do some physical activity or stretching between periods of rest. Avoid sitting for a long time without moving. Get up and move around at least one time each  hour. Do exercises and stretches as told by your doctor. Do not lift anything that is heavier than 10 lb (4.5 kg). Avoid lifting heavy things even when you do not have symptoms. Avoid lifting heavy things over and over. When you lift objects, always lift in a way that is safe for your body. To do this, you should: Bend your knees. Keep the object close to your body. Avoid twisting. General instructions Stay at a healthy weight. Wear comfortable shoes that support your feet. Avoid wearing high heels. Avoid sleeping on a mattress  that is too soft or too hard. You might have less pain if you sleep on a mattress that is firm enough to support your back. Contact a doctor if: Your pain is not controlled by medicine. Your pain does not get better. Your pain gets worse. Your pain lasts longer than 4 weeks. You lose weight without trying. Get help right away if: You cannot control when you pee (urinate) or poop (have a bowel movement). You have weakness in any of these areas and it gets worse: Lower back. The area between your hip bones. Butt. Legs. You have redness or swelling of your back. You have a burning feeling when you pee. Summary Sciatica is pain, weakness, tingling, or loss of feeling (numbness) along the sciatic nerve. This may include the lower back, legs, hips, and butt. This condition happens when the sciatic nerve is pinched or has pressure put on it. Treatment often includes rest, exercise, medicines, and putting ice or heat on the affected area. This information is not intended to replace advice given to you by your health care provider. Make sure you discuss any questions you have with your health care provider. Document Revised: 12/15/2021 Document Reviewed: 12/15/2021 Elsevier Patient Education  2024 [employer].

## 2023-11-23 NOTE — Progress Notes (Signed)
 Subjective:    Patient ID: Michelle Blake, female    DOB: 28-Oct-1970, 53 y.o.   MRN: [id number]   Chief Complaint: medical management of chronic issues     HPI:  Michelle Blake is a 53 y.o. who identifies as a female who was assigned female at birth.   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 Essential hypertension No c/o chest pain, sob or headache. Does not check blood pressure at home. BP Readings from Last 3 Encounters:  05/28/23 109/80  03/04/23 (!) 142/102  09/17/22 119/81     2. Gastroesophageal reflux disease without esophagitis Is on prevacid daily  3. Current smoker Smokes over a pack a day.   4.  Herpes Simplex I Has occasional outbreaks. Seems to be worse during the summer months. Has valtrex on hand to take as needed.   5. Obesity (BMI 30-39.9) Weight is up 9 lbs  Wt Readings from Last 3 Encounters:  11/23/23 248 lb (112.5 kg)  05/28/23 239 lb (108.4 kg)  03/04/23 237 lb (107.5 kg)   BMI Readings from Last 3 Encounters:  11/23/23 40.03 kg/m  05/28/23 38.58 kg/m  03/04/23 38.25 kg/m      New complaints: Lower back pain. Rates pain 8/10. Radiates down both legs at times. Going from sitting to standing is difficult. Has mirena- wants to know if she is menopausal  Allergies  Allergen Reactions   Ace Inhibitors Cough   Outpatient Encounter Medications as of 11/23/2023  Medication Sig   aspirin EC 81 MG tablet Take 81 mg by mouth daily. Swallow whole.   busPIRone (BUSPAR) 10 MG tablet Take 1 tablet (10 mg total) by mouth 2 (two) times daily.   cephALEXin (KEFLEX) 500 MG capsule Take 1 capsule (500 mg total) by mouth 2 (two) times daily.   lansoprazole (PREVACID) 30 MG capsule Take 1 capsule (30 mg total) by mouth daily at 12 noon.   levonorgestrel (MIRENA) 20 MCG/24HR IUD 1 each by Intrauterine route once.   losartan  (COZAAR) 100 MG tablet Take 1 tablet (100 mg total) by mouth daily.   rosuvastatin (CRESTOR) 10 MG tablet Take 1 tablet (10 mg total) by mouth daily.   valACYclovir (VALTREX) 1000 MG tablet Take 1 tablet (1,000 mg total) by mouth 2 (two) times daily.   No facility-administered encounter medications on file as of 11/23/2023.    Past Surgical History:  Procedure Laterality Date   FOOT SURGERY     WISDOM TOOTH EXTRACTION      Family History  Problem Relation Age of Onset   Cancer Father        bladder   Hypertension Father    Parkinson's disease Father    Breast cancer Neg Hx       Controlled substance contract: n/a     Review of Systems  Constitutional:  Negative for diaphoresis.  Eyes:  Negative for pain.  Respiratory:  Negative for shortness of breath.   Cardiovascular:  Negative for chest pain, palpitations and leg swelling.  Gastrointestinal:  Negative for abdominal pain.  Endocrine: Negative for polydipsia.  Skin:  Negative for rash.  Neurological:  Negative for dizziness, weakness and headaches.  Hematological:  Does not bruise/bleed easily.  All other systems reviewed and are negative.      Objective:   Physical Exam Vitals and nursing note reviewed.  Constitutional:      General: She is not in acute distress.    Appearance: Normal appearance. She is well-developed.  HENT:     Head: Normocephalic.     Right Ear: Tympanic membrane normal.     Left Ear: Tympanic membrane normal.     Nose: Nose normal.     Mouth/Throat:     Mouth: Mucous membranes are moist.  Eyes:     Pupils: Pupils are equal, round, and reactive to light.  Neck:     Vascular: No carotid bruit or JVD.  Cardiovascular:     Rate and Rhythm: Normal rate and regular rhythm.     Heart sounds: Normal heart sounds.  Pulmonary:     Effort: Pulmonary effort is normal. No respiratory distress.     Breath sounds: Normal breath sounds. No wheezing or rales.  Chest:     Chest wall: No  tenderness.  Abdominal:     General: Bowel sounds are normal. There is no distension or abdominal bruit.     Palpations: Abdomen is soft. There is no hepatomegaly, splenomegaly, mass or pulsatile mass.     Tenderness: There is no abdominal tenderness.  Musculoskeletal:        General: Normal range of motion.     Cervical back: Normal range of motion and neck supple.  Lymphadenopathy:     Cervical: No cervical adenopathy.  Skin:    General: Skin is warm and dry.  Neurological:     Mental Status: She is alert and oriented to person, place, and time.     Deep Tendon Reflexes: Reflexes are normal and symmetric.  Psychiatric:        Behavior: Behavior normal.        Thought Content: Thought content normal.        Judgment: Judgment normal.     BP 111/84   Pulse 91   Temp 97.7 F (36.5 C) (Temporal)   Ht 5\' 6"  (1.676 m)   Wt 248 lb (112.5 kg)   SpO2 95%   BMI 40.03 kg/m         Assessment & Plan:  Kerilyn Cortner Mom comes in today with chief complaint of Medical Management of Chronic Issues   Diagnosis and orders addressed:  1. Essential hypertension (Primary) Low sodium diet - CBC with Differential/Platelet - CMP14+EGFR - Lipid panel - losartan (COZAAR) 100 MG tablet; Take 1 tablet (100 mg total) by mouth daily.  Dispense: 90 tablet; Refill: 1 - rosuvastatin (CRESTOR) 10 MG tablet; Take 1 tablet (10 mg total) by mouth daily.  Dispense: 90 tablet; Refill: 1  2. Gastroesophageal reflux disease without esophagitis Avoid spicy foods Do not eat 2 hours prior to bedtime - lansoprazole (PREVACID) 30 MG capsule; Take 1 capsule (30 mg total) by mouth daily at 12 noon.  Dispense: 90 capsule; Refill: 1  3. Current smoker Smoking cessation  4. Herpes simplex type 1 infection  - valACYclovir (VALTREX) 1000 MG tablet; Take 1 tablet (1,000 mg total) by mouth 2 (two) times daily.  Dispense: 30 tablet; Refill: 1  5. Obesity (BMI 30-39.9) Discussed diet and exercise for person  with BMI >25 Will recheck weight in 3-6 months   6. Bilateral sciatica Moist heat to lower back rest - methylPREDNISolone acetate (DEPO-MEDROL) injection 80 mg - ketorolac (TORADOL) injection 60 mg  7. Perimenopausal vasomotor symptoms Labs pending - AMENORRHEA PROFILE   Labs pending Health Maintenance reviewed Diet and exercise encouraged  Follow up plan: 6 months  Mary-Margaret Daphine Deutscher, FNP

## 2023-11-23 NOTE — Addendum Note (Signed)
 Addended by: Cleda Daub on: 11/23/2023 12:48 PM   Modules accepted: Orders

## 2023-11-24 LAB — CMP14+EGFR
ALT: 17 IU/L (ref 0–32)
AST: 18 IU/L (ref 0–40)
Albumin: 4 g/dL (ref 3.8–4.9)
Alkaline Phosphatase: 94 IU/L (ref 44–121)
BUN/Creatinine Ratio: 10 (ref 9–23)
BUN: 10 mg/dL (ref 6–24)
Bilirubin Total: 0.2 mg/dL (ref 0.0–1.2)
CO2: 21 mmol/L (ref 20–29)
Calcium: 8.5 mg/dL — ABNORMAL LOW (ref 8.7–10.2)
Chloride: 105 mmol/L (ref 96–106)
Creatinine, Ser: 1.04 mg/dL — ABNORMAL HIGH (ref 0.57–1.00)
Globulin, Total: 2.3 g/dL (ref 1.5–4.5)
Glucose: 110 mg/dL — ABNORMAL HIGH (ref 70–99)
Potassium: 4.5 mmol/L (ref 3.5–5.2)
Sodium: 141 mmol/L (ref 134–144)
Total Protein: 6.3 g/dL (ref 6.0–8.5)
eGFR: 65 mL/min/{1.73_m2} (ref >59)

## 2023-11-24 LAB — AMENORRHEA PROFILE
FSH: 46.9 m[IU]/mL
LH: 34.7 m[IU]/mL
Prolactin: 15.6 ng/mL (ref 3.6–25.2)

## 2023-11-24 LAB — CBC WITH DIFFERENTIAL/PLATELET
Basophils Absolute: 0 10*3/uL (ref 0.0–0.2)
Basos: 0 %
EOS (ABSOLUTE): 0.1 10*3/uL (ref 0.0–0.4)
Eos: 2 %
Hematocrit: 42.8 % (ref 34.0–46.6)
Hemoglobin: 14.3 g/dL (ref 11.1–15.9)
Immature Grans (Abs): 0 10*3/uL (ref 0.0–0.1)
Immature Granulocytes: 0 %
Lymphocytes Absolute: 2.2 10*3/uL (ref 0.7–3.1)
Lymphs: 25 %
MCH: 31.6 pg (ref 26.6–33.0)
MCHC: 33.4 g/dL (ref 31.5–35.7)
MCV: 95 fL (ref 79–97)
Monocytes Absolute: 0.5 10*3/uL (ref 0.1–0.9)
Monocytes: 5 %
Neutrophils Absolute: 5.8 10*3/uL (ref 1.4–7.0)
Neutrophils: 68 %
Platelets: 268 10*3/uL (ref 150–450)
RBC: 4.52 x10E6/uL (ref 3.77–5.28)
RDW: 12.7 % (ref 11.7–15.4)
WBC: 8.6 10*3/uL (ref 3.4–10.8)

## 2023-11-24 LAB — LIPID PANEL
Chol/HDL Ratio: 3.4 ratio (ref 0.0–4.4)
Cholesterol, Total: 136 mg/dL (ref 100–199)
HDL: 40 mg/dL (ref >39)
LDL Chol Calc (NIH): 77 mg/dL (ref 0–99)
Triglycerides: 105 mg/dL (ref 0–149)
VLDL Cholesterol Cal: 19 mg/dL (ref 5–40)

## 2024-05-02 ENCOUNTER — Other Ambulatory Visit: Payer: Self-pay | Admitting: Nurse Practitioner

## 2024-05-02 DIAGNOSIS — K219 Gastro-esophageal reflux disease without esophagitis: Secondary | ICD-10-CM

## 2024-05-12 ENCOUNTER — Other Ambulatory Visit: Payer: Self-pay | Admitting: Nurse Practitioner

## 2024-05-12 DIAGNOSIS — I10 Essential (primary) hypertension: Secondary | ICD-10-CM

## 2024-05-25 ENCOUNTER — Ambulatory Visit: Admitting: Nurse Practitioner

## 2024-05-25 ENCOUNTER — Encounter: Payer: Self-pay | Admitting: Nurse Practitioner

## 2024-05-25 VITALS — BP 121/86 | HR 90 | Temp 97.4°F | Ht 66.0 in | Wt 245.0 lb

## 2024-05-25 DIAGNOSIS — I10 Essential (primary) hypertension: Secondary | ICD-10-CM | POA: Diagnosis not present

## 2024-05-25 DIAGNOSIS — Z0000 Encounter for general adult medical examination without abnormal findings: Secondary | ICD-10-CM | POA: Diagnosis not present

## 2024-05-25 DIAGNOSIS — K219 Gastro-esophageal reflux disease without esophagitis: Secondary | ICD-10-CM

## 2024-05-25 DIAGNOSIS — F17200 Nicotine dependence, unspecified, uncomplicated: Secondary | ICD-10-CM

## 2024-05-25 DIAGNOSIS — Z23 Encounter for immunization: Secondary | ICD-10-CM | POA: Diagnosis not present

## 2024-05-25 DIAGNOSIS — E669 Obesity, unspecified: Secondary | ICD-10-CM

## 2024-05-25 DIAGNOSIS — Z30432 Encounter for removal of intrauterine contraceptive device: Secondary | ICD-10-CM

## 2024-05-25 MED ORDER — ROSUVASTATIN CALCIUM 10 MG PO TABS: 10.0000 mg | ORAL_TABLET | Freq: Every day | ORAL | 1 refills | Status: DC

## 2024-05-25 MED ORDER — LANSOPRAZOLE 30 MG PO CPDR: 30.0000 mg | DELAYED_RELEASE_CAPSULE | Freq: Every day | ORAL | 1 refills | Status: AC

## 2024-05-25 MED ORDER — LOSARTAN POTASSIUM 100 MG PO TABS: 100.0000 mg | ORAL_TABLET | Freq: Every day | ORAL | 1 refills | Status: DC

## 2024-05-25 NOTE — Addendum Note (Signed)
 Addended by: VIKTORIA ALAN MATSU on: 05/25/2024 09:13 AM   Modules accepted: Orders

## 2024-05-25 NOTE — Addendum Note (Signed)
 Addended by: Amine Adelson, MARY-MARGARET on: 05/25/2024 09:03 AM   Modules accepted: Orders

## 2024-05-25 NOTE — Progress Notes (Addendum)
 Subjective:    Patient ID: Michelle Blake, female    DOB: 1971/08/29, 53 y.o.   MRN: [id number]   Chief Complaint: annual physical   HPI:  Michelle Blake is a 53 y.o. who identifies as a female who was assigned female at birth.   Social history: Lives with: husband Work history: bank   Comes in today for follow up of the following chronic medical issues:  1. Annual physical exam Will remove mirena  today  2. Essential hypertension No c/o chest pain, sob or headache. Does not check blood pressure at homme. BP Readings from Last 3 Encounters:  11/23/23 111/84  05/28/23 109/80  03/04/23 (!) 142/102    3. Gastroesophageal reflux disease without esophagitis Is on prevacid  and is doing well.  4. Current smoker Smokes over a pack a day. Has refused low dose CT scan in past  5. Obesity (BMI 30-39.9) No recent weight changes Wt Readings from Last 3 Encounters:  11/23/23 248 lb (112.5 kg)  05/28/23 239 lb (108.4 kg)  03/04/23 237 lb (107.5 kg)   BMI Readings from Last 3 Encounters:  11/23/23 40.03 kg/m  05/28/23 38.58 kg/m  03/04/23 38.25 kg/m      New complaints: none  Allergies  Allergen Reactions   Ace Inhibitors Cough   Outpatient Encounter Medications as of 05/25/2024  Medication Sig   aspirin EC 81 MG tablet Take 81 mg by mouth daily. Swallow whole.   lansoprazole  (PREVACID ) 30 MG capsule TAKE 1 CAPSULE DAILY AT 12 NOON   levonorgestrel  (MIRENA ) 20 MCG/24HR IUD 1 each by Intrauterine route once.   losartan  (COZAAR ) 100 MG tablet TAKE 1 TABLET DAILY   rosuvastatin  (CRESTOR ) 10 MG tablet TAKE 1 TABLET DAILY   valACYclovir  (VALTREX ) 1000 MG tablet Take 1 tablet (1,000 mg total) by mouth 2 (two) times daily.   No facility-administered encounter medications on file as of 05/25/2024.    Past Surgical History:  Procedure Laterality Date   FOOT SURGERY     WISDOM TOOTH EXTRACTION      Family History  Problem Relation Age of Onset   Cancer Father         bladder   Hypertension Father    Parkinson's disease Father    Breast cancer Neg Hx       Controlled substance contract: n/a     Review of Systems  Constitutional:  Negative for diaphoresis.  Eyes:  Negative for pain.  Respiratory:  Negative for shortness of breath.   Cardiovascular:  Negative for chest pain, palpitations and leg swelling.  Gastrointestinal:  Negative for abdominal pain.  Endocrine: Negative for polydipsia.  Skin:  Negative for rash.  Neurological:  Negative for dizziness, weakness and headaches.  Hematological:  Does not bruise/bleed easily.  All other systems reviewed and are negative.      Objective:   Physical Exam Vitals and nursing note reviewed.  Constitutional:      General: She is not in acute distress.    Appearance: Normal appearance. She is well-developed.  HENT:     Head: Normocephalic.     Right Ear: Tympanic membrane normal.     Left Ear: Tympanic membrane normal.     Nose: Nose normal.     Mouth/Throat:     Mouth: Mucous membranes are moist.  Eyes:     Pupils: Pupils are equal, round, and reactive to light.  Neck:     Vascular: No carotid bruit or JVD.  Cardiovascular:  Rate and Rhythm: Normal rate and regular rhythm.     Heart sounds: Normal heart sounds.  Pulmonary:     Effort: Pulmonary effort is normal. No respiratory distress.     Breath sounds: Normal breath sounds. No wheezing or rales.  Chest:     Chest wall: No tenderness.  Abdominal:     General: Bowel sounds are normal. There is no distension or abdominal bruit.     Palpations: Abdomen is soft. There is no hepatomegaly, splenomegaly, mass or pulsatile mass.     Tenderness: There is no abdominal tenderness.  Genitourinary:    General: Normal vulva.     Vagina: No vaginal discharge.     Rectum: Normal.     Comments: Cervix parous and pink No adnexal mass or tenderness Musculoskeletal:        General: Normal range of motion.     Cervical back: Normal range  of motion and neck supple.  Lymphadenopathy:     Cervical: No cervical adenopathy.  Skin:    General: Skin is warm and dry.  Neurological:     Mental Status: She is alert and oriented to person, place, and time.     Deep Tendon Reflexes: Reflexes are normal and symmetric.  Psychiatric:        Behavior: Behavior normal.        Thought Content: Thought content normal.        Judgment: Judgment normal.     BP 121/86   Pulse 90   Temp (!) 97.4 F (36.3 C) (Temporal)   Ht 5' 6 (1.676 m)   Wt 245 lb (111.1 kg)   BMI 39.54 kg/m   IUD removed intact      Assessment & Plan:   Michelle Blake comes in today with chief complaint of annual physical  Diagnosis and orders addressed:  1. Annual physical exam Mirena  due to be changed 3/26 - CBC with Differential/Platelet - Lipid panel - Thyroid  Panel With TSH - VITAMIN D  25 Hydroxy (Vit-D Deficiency, Fractures) - Cytology - PAP  2. Essential hypertension Low sodium diet - losartan  (COZAAR ) 100 MG tablet; Take 1 tablet (100 mg total) by mouth daily.  Dispense: 90 tablet; Refill: 1 - rosuvastatin  (CRESTOR ) 10 MG tablet; Take 1 tablet (10 mg total) by mouth daily.  Dispense: 90 tablet; Refill: 1 - CMP14+EGFR  3. Gastroesophageal reflux disease without esophagitis Avoid spicy foods Do not eat 2 hours prior to bedtime  - lansoprazole  (PREVACID ) 30 MG capsule; Take 1 capsule (30 mg total) by mouth daily at 12 noon.  Dispense: 90 capsule; Refill: 1  4. Current smoker Smoking cessation encouraged  5. Obesity (BMI 30-39.9) Discussed diet and exercise for person with BMI >25 Will recheck weight in 3-6 months   6. Anxiety Stress management - busPIRone  (BUSPAR ) 10 MG tablet; Take 1 tablet (10 mg total) by mouth 2 (two) times daily.  Dispense: 60 tablet; Refill: 5  7. IUD removal Mirena  intact  Labs pending Health Maintenance reviewed Diet and exercise encouraged  Follow up plan: 6 months   Michelle Gladis,  FNP

## 2024-05-25 NOTE — Patient Instructions (Signed)

## 2024-05-26 ENCOUNTER — Ambulatory Visit: Payer: Self-pay | Admitting: Nurse Practitioner

## 2024-05-26 LAB — THYROID PANEL WITH TSH
Free Thyroxine Index: 2.5 (ref 1.2–4.9)
T3 Uptake Ratio: 28 (ref 24–39)
T4, Total: 8.8 ug/dL (ref 4.5–12.0)
TSH: 1.29 u[IU]/mL (ref 0.450–4.500)

## 2024-05-26 LAB — CMP14+EGFR
ALT: 13 IU/L (ref 0–32)
AST: 19 IU/L (ref 0–40)
Albumin: 4.1 g/dL (ref 3.8–4.9)
Alkaline Phosphatase: 89 IU/L (ref 44–121)
BUN/Creatinine Ratio: 12 (ref 9–23)
BUN: 14 mg/dL (ref 6–24)
Bilirubin Total: 0.3 mg/dL (ref 0.0–1.2)
CO2: 21 mmol/L (ref 20–29)
Calcium: 8.9 mg/dL (ref 8.7–10.2)
Chloride: 105 mmol/L (ref 96–106)
Creatinine, Ser: 1.14 mg/dL — ABNORMAL HIGH (ref 0.57–1.00)
Globulin, Total: 2.3 g/dL (ref 1.5–4.5)
Glucose: 113 mg/dL — ABNORMAL HIGH (ref 70–99)
Potassium: 4.4 mmol/L (ref 3.5–5.2)
Sodium: 141 mmol/L (ref 134–144)
Total Protein: 6.4 g/dL (ref 6.0–8.5)
eGFR: 58 mL/min/1.73 — ABNORMAL LOW (ref >59)

## 2024-05-26 LAB — VITAMIN D 25 HYDROXY (VIT D DEFICIENCY, FRACTURES): Vit D, 25-Hydroxy: 27.8 ng/mL — AB (ref 30.0–100.0)

## 2024-05-26 LAB — CBC WITH DIFFERENTIAL/PLATELET
Basophils Absolute: 0 x10E3/uL (ref 0.0–0.2)
Basos: 0 %
EOS (ABSOLUTE): 0.2 x10E3/uL (ref 0.0–0.4)
Eos: 2 %
Hematocrit: 44.1 % (ref 34.0–46.6)
Hemoglobin: 14.4 g/dL (ref 11.1–15.9)
Immature Grans (Abs): 0 x10E3/uL (ref 0.0–0.1)
Immature Granulocytes: 0 %
Lymphocytes Absolute: 2.2 x10E3/uL (ref 0.7–3.1)
Lymphs: 25 %
MCH: 32.1 pg (ref 26.6–33.0)
MCHC: 32.7 g/dL (ref 31.5–35.7)
MCV: 98 fL — ABNORMAL HIGH (ref 79–97)
Monocytes Absolute: 0.6 x10E3/uL (ref 0.1–0.9)
Monocytes: 7 %
Neutrophils Absolute: 5.6 x10E3/uL (ref 1.4–7.0)
Neutrophils: 66 %
Platelets: 263 x10E3/uL (ref 150–450)
RBC: 4.49 x10E6/uL (ref 3.77–5.28)
RDW: 12.9 % (ref 11.7–15.4)
WBC: 8.6 x10E3/uL (ref 3.4–10.8)

## 2024-05-26 LAB — LIPID PANEL
Chol/HDL Ratio: 3.8 ratio (ref 0.0–4.4)
Cholesterol, Total: 124 mg/dL (ref 100–199)
HDL: 33 mg/dL — ABNORMAL LOW (ref >39)
LDL Chol Calc (NIH): 66 mg/dL (ref 0–99)
Triglycerides: 140 mg/dL (ref 0–149)
VLDL Cholesterol Cal: 25 mg/dL (ref 5–40)

## 2024-07-25 ENCOUNTER — Other Ambulatory Visit (HOSPITAL_COMMUNITY): Payer: Self-pay | Admitting: Nurse Practitioner

## 2024-07-25 DIAGNOSIS — Z1231 Encounter for screening mammogram for malignant neoplasm of breast: Secondary | ICD-10-CM

## 2024-08-07 ENCOUNTER — Encounter (HOSPITAL_COMMUNITY): Payer: Self-pay

## 2024-08-07 ENCOUNTER — Other Ambulatory Visit (HOSPITAL_COMMUNITY): Payer: Self-pay | Admitting: Nurse Practitioner

## 2024-08-07 ENCOUNTER — Ambulatory Visit (HOSPITAL_COMMUNITY)
Admission: RE | Admit: 2024-08-07 | Discharge: 2024-08-07 | Disposition: A | Discharge status: Home / Self Care | Source: Ambulatory Visit | Attending: Nurse Practitioner | Admitting: Nurse Practitioner

## 2024-08-07 ENCOUNTER — Inpatient Hospital Stay
Admission: RE | Admit: 2024-08-07 | Discharge: 2024-08-07 | Disposition: A | Discharge status: Home / Self Care | Payer: Self-pay | Source: Ambulatory Visit | Attending: Nurse Practitioner | Admitting: Nurse Practitioner

## 2024-08-07 DIAGNOSIS — Z1231 Encounter for screening mammogram for malignant neoplasm of breast: Secondary | ICD-10-CM | POA: Insufficient documentation

## 2024-08-10 ENCOUNTER — Other Ambulatory Visit: Payer: Self-pay | Admitting: Nurse Practitioner

## 2024-08-10 DIAGNOSIS — I10 Essential (primary) hypertension: Secondary | ICD-10-CM

## 2024-11-20 ENCOUNTER — Ambulatory Visit: Payer: Self-pay | Admitting: Nurse Practitioner
